# Patient Record
Sex: Female | Born: 1935
Health system: Southern US, Community
[De-identification: ages and names within clinical notes are randomized; demographics above are authoritative.]

## PROBLEM LIST (undated history)

## (undated) DIAGNOSIS — M797 Fibromyalgia: Secondary | ICD-10-CM

## (undated) DIAGNOSIS — K5790 Diverticulosis of intestine, part unspecified, without perforation or abscess without bleeding: Secondary | ICD-10-CM

## (undated) DIAGNOSIS — R202 Paresthesia of skin: Secondary | ICD-10-CM

## (undated) DIAGNOSIS — I10 Essential (primary) hypertension: Secondary | ICD-10-CM

## (undated) DIAGNOSIS — E785 Hyperlipidemia, unspecified: Secondary | ICD-10-CM

## (undated) DIAGNOSIS — M810 Age-related osteoporosis without current pathological fracture: Secondary | ICD-10-CM

## (undated) DIAGNOSIS — C649 Malignant neoplasm of unspecified kidney, except renal pelvis: Secondary | ICD-10-CM

## (undated) HISTORY — PX: TONSILLECTOMY: SUR1361

## (undated) HISTORY — DX: Malignant neoplasm of unspecified kidney, except renal pelvis: C64.9

## (undated) HISTORY — DX: Essential (primary) hypertension: I10

## (undated) HISTORY — DX: Age-related osteoporosis without current pathological fracture: M81.0

## (undated) HISTORY — DX: Diverticulosis of intestine, part unspecified, without perforation or abscess without bleeding: K57.90

## (undated) HISTORY — DX: Fibromyalgia: M79.7

## (undated) HISTORY — DX: Paresthesia of skin: R20.2

---

## 1998-02-28 ENCOUNTER — Other Ambulatory Visit: Admission: RE | Admit: 1998-02-28 | Discharge: 1998-02-28 | Payer: Self-pay | Admitting: Gynecology

## 1998-04-17 ENCOUNTER — Observation Stay (HOSPITAL_COMMUNITY): Admission: RE | Admit: 1998-04-17 | Discharge: 1998-04-18 | Payer: Self-pay | Admitting: *Deleted

## 1999-03-01 ENCOUNTER — Other Ambulatory Visit: Admission: RE | Admit: 1999-03-01 | Discharge: 1999-03-01 | Payer: Self-pay | Admitting: Gynecology

## 2000-04-16 ENCOUNTER — Other Ambulatory Visit: Admission: RE | Admit: 2000-04-16 | Discharge: 2000-04-16 | Payer: Self-pay | Admitting: Gynecology

## 2000-12-25 ENCOUNTER — Other Ambulatory Visit: Admission: RE | Admit: 2000-12-25 | Discharge: 2000-12-25 | Payer: Self-pay | Admitting: Gastroenterology

## 2001-05-06 ENCOUNTER — Other Ambulatory Visit: Admission: RE | Admit: 2001-05-06 | Discharge: 2001-05-06 | Payer: Self-pay | Admitting: Gynecology

## 2002-05-10 ENCOUNTER — Other Ambulatory Visit: Admission: RE | Admit: 2002-05-10 | Discharge: 2002-05-10 | Payer: Self-pay | Admitting: Gynecology

## 2002-11-17 ENCOUNTER — Ambulatory Visit (HOSPITAL_COMMUNITY): Admission: RE | Admit: 2002-11-17 | Discharge: 2002-11-17 | Payer: Self-pay | Admitting: Gastroenterology

## 2002-11-17 ENCOUNTER — Encounter (INDEPENDENT_AMBULATORY_CARE_PROVIDER_SITE_OTHER): Payer: Self-pay | Admitting: Specialist

## 2003-05-23 ENCOUNTER — Other Ambulatory Visit: Admission: RE | Admit: 2003-05-23 | Discharge: 2003-05-23 | Payer: Self-pay | Admitting: Gynecology

## 2005-03-14 ENCOUNTER — Encounter: Admission: RE | Admit: 2005-03-14 | Discharge: 2005-03-14 | Payer: Self-pay | Admitting: Internal Medicine

## 2005-10-18 ENCOUNTER — Ambulatory Visit (HOSPITAL_COMMUNITY): Admission: RE | Admit: 2005-10-18 | Discharge: 2005-10-18 | Payer: Self-pay | Admitting: Internal Medicine

## 2006-08-11 ENCOUNTER — Other Ambulatory Visit: Admission: RE | Admit: 2006-08-11 | Discharge: 2006-08-11 | Payer: Self-pay | Admitting: Gynecology

## 2008-08-08 ENCOUNTER — Encounter: Admission: RE | Admit: 2008-08-08 | Discharge: 2008-08-08 | Payer: Self-pay | Admitting: Internal Medicine

## 2008-08-22 ENCOUNTER — Encounter: Admission: RE | Admit: 2008-08-22 | Discharge: 2008-08-22 | Payer: Self-pay | Admitting: Internal Medicine

## 2008-09-02 DIAGNOSIS — C649 Malignant neoplasm of unspecified kidney, except renal pelvis: Secondary | ICD-10-CM

## 2008-09-02 HISTORY — DX: Malignant neoplasm of unspecified kidney, except renal pelvis: C64.9

## 2008-10-06 HISTORY — PX: KIDNEY SURGERY: SHX687

## 2011-01-18 NOTE — Op Note (Signed)
   NAMEDESSIREE, SZE [facility]                      ACCOUNT NO.:  [id number]   MEDICAL RECORD NO.:  [id number]                   PATIENT TYPE:  AMB   LOCATION:  ENDO                                 FACILITY:  [facility]   PHYSICIAN:  Bernette Redbird, M.D.                DATE OF BIRTH:  06-06-1927   DATE OF PROCEDURE:  11/17/2002  DATE OF DISCHARGE:                                 OPERATIVE REPORT   PROCEDURE:  Colonoscopy with polypectomy.   INDICATIONS FOR PROCEDURE:  This is a 77 year old female with a family  history of colon cancer in several aunts and a cousin all on the same side  of the family. Colonoscopy five years ago was unrevealing.   FINDINGS:  A 5 mm polyp removed from the transverse colon by snare  technique.   DESCRIPTION OF PROCEDURE:  The nature, purpose and risk of the procedure  were familiar to the patient from prior examination. She provided written  consent. Sedation was fentanyl 75 mcg and Versed 6 mg IV without arrhythmias  or desaturation.   The Olympus adult video colonoscope was advanced without too much  difficulty, taking out loops as I went, based on prior __________ exam to be  difficult with looping. We thus were able to reach the cecum as identified  by a typical cecal appearance without much difficulty and pullback was then  performed. The quality of the prep was excellent and it was felt that all  areas were well seen.   There was a 5 mm polyp snared in the proximal transverse colon but this was  otherwise a normal examination, without other polyps, cancer, colitis,  vascular malformations or diverticulosis. Retroflexion in the rectum showed  what appeared to be some internal hemorrhoids and reinspection of the  rectosigmoid was unremarkable. The patient tolerated the procedure well and  there were no apparent complications.   IMPRESSION:  Small colon polyp removed as described above.   PLAN:  Await pathology. Followup colonoscopy in three years if  adenomatous  in character.                                               Bernette Redbird, M.D.    RB/MEDQ  D:  11/17/2002  T:  11/17/2002  Job:  [id number]   cc:   Larina Earthly, M.D.  [address]  [place]  [place] [address]  Fax: [phone]

## 2011-10-21 ENCOUNTER — Other Ambulatory Visit: Payer: Self-pay | Admitting: Internal Medicine

## 2011-10-21 DIAGNOSIS — K7689 Other specified diseases of liver: Secondary | ICD-10-CM

## 2011-10-30 ENCOUNTER — Ambulatory Visit
Admission: RE | Admit: 2011-10-30 | Discharge: 2011-10-30 | Disposition: A | Discharge status: Home / Self Care | Payer: Medicare Other | Source: Ambulatory Visit | Attending: Internal Medicine | Admitting: Internal Medicine

## 2011-10-30 ENCOUNTER — Other Ambulatory Visit: Payer: Self-pay | Admitting: Internal Medicine

## 2011-10-30 DIAGNOSIS — K7689 Other specified diseases of liver: Secondary | ICD-10-CM

## 2012-05-03 HISTORY — PX: EYE SURGERY: SHX253

## 2012-11-09 ENCOUNTER — Emergency Department (HOSPITAL_COMMUNITY): Payer: Medicare HMO

## 2012-11-09 ENCOUNTER — Emergency Department (HOSPITAL_COMMUNITY)
Admission: EM | Admit: 2012-11-09 | Discharge: 2012-11-09 | Disposition: A | Discharge status: Home / Self Care | Payer: Medicare HMO | Attending: Emergency Medicine | Admitting: Emergency Medicine

## 2012-11-09 ENCOUNTER — Encounter (HOSPITAL_COMMUNITY): Payer: Self-pay | Admitting: Emergency Medicine

## 2012-11-09 ENCOUNTER — Other Ambulatory Visit: Payer: Self-pay | Admitting: Rheumatology

## 2012-11-09 DIAGNOSIS — Z79899 Other long term (current) drug therapy: Secondary | ICD-10-CM | POA: Insufficient documentation

## 2012-11-09 DIAGNOSIS — Y939 Activity, unspecified: Secondary | ICD-10-CM | POA: Insufficient documentation

## 2012-11-09 DIAGNOSIS — Y929 Unspecified place or not applicable: Secondary | ICD-10-CM | POA: Insufficient documentation

## 2012-11-09 DIAGNOSIS — S0990XA Unspecified injury of head, initial encounter: Secondary | ICD-10-CM | POA: Insufficient documentation

## 2012-11-09 DIAGNOSIS — W108XXA Fall (on) (from) other stairs and steps, initial encounter: Secondary | ICD-10-CM | POA: Insufficient documentation

## 2012-11-09 DIAGNOSIS — M8580 Other specified disorders of bone density and structure, unspecified site: Secondary | ICD-10-CM

## 2012-11-09 DIAGNOSIS — M25569 Pain in unspecified knee: Secondary | ICD-10-CM | POA: Insufficient documentation

## 2012-11-09 DIAGNOSIS — E785 Hyperlipidemia, unspecified: Secondary | ICD-10-CM | POA: Insufficient documentation

## 2012-11-09 HISTORY — DX: Hyperlipidemia, unspecified: E78.5

## 2012-11-09 MED ORDER — IBUPROFEN 800 MG PO TABS
800.0000 mg | ORAL_TABLET | Freq: Once | ORAL | Status: AC
  Administered 2012-11-09: 800 mg via ORAL
  Filled 2012-11-09: qty 1

## 2012-11-09 MED ORDER — TRAMADOL HCL 50 MG PO TABS: 50.0000 mg | ORAL_TABLET | Freq: Three times a day (TID) | ORAL | Status: DC | PRN

## 2012-11-09 NOTE — ED Notes (Addendum)
Pt presenting to ed with c/o falling while in [place] and hitting her head on the steps on the escalator. Pt denies Loc. Pt is alert and oriented at this time. Pt states there was a problem with the escalator in [place] the handrail was going opposite of the escalator. Pt states that's what caused  her to fall. Pt states that they had to turn the escalator off. Pt denies lightheadedness, dizziness, and chest pain at this time

## 2012-11-09 NOTE — ED Provider Notes (Signed)
History     CSN: [id number]  Arrival date & time 11/09/12  1550   First MD Initiated Contact with Patient 11/09/12 1652      Chief Complaint  Patient presents with  . Fall  . Head Injury    (Consider location/radiation/quality/duration/timing/severity/associated sxs/prior treatment) HPI  The patient presents after falling while on an escalator.  She states that she fell backwards, striking her head and upper shoulders.  No loss of consciousness, no subsequent confusion, disorientation, vomiting, diarrhea, incontinence.  The patient has not been able to recent event.  She also complains of left knee pain.  No attempts at relief thus far. The patient states that she was in her usual state of health prior to the fall.   Past Medical History  Diagnosis Date  . Hyperlipidemia     Past Surgical History  Procedure Laterality Date  . Kidney surgery    . Tonsillectomy      No family history on file.  History  Substance Use Topics  . Smoking status: Never Smoker   . Smokeless tobacco: Not on file  . Alcohol Use: No    OB History   Grav Para Term Preterm Abortions TAB SAB Ect Mult Living                  Review of Systems  All other systems reviewed and are negative.    Allergies  Latex  Home Medications   Current Outpatient Rx  Name  Route  Sig  Dispense  Refill  . calcium carbonate (OS-CAL) 600 MG TABS   Oral   Take 600 mg by mouth 2 (two) times daily with a meal.         . cholecalciferol (VITAMIN D) 1000 UNITS tablet   Oral   Take 1,000 Units by mouth daily.         Marland Kitchen Fexofenadine HCl (ALLEGRA PO)   Oral   Take 1 tablet by mouth daily.         . fish oil-omega-3 fatty acids 1000 MG capsule   Oral   Take 1 g by mouth daily.         . Multiple Vitamin (MULTIVITAMIN WITH MINERALS) TABS   Oral   Take 1 tablet by mouth daily.         . Pitavastatin Calcium (LIVALO) 2 MG TABS   Oral   Take 1 tablet by mouth daily.           BP  181/67  Pulse 71  Temp(Src) 98.5 F (36.9 C) (Oral)  Resp 20  SpO2 100%  Physical Exam  Nursing note and vitals reviewed. Constitutional: She is oriented to person, place, and time. She appears well-developed and well-nourished. No distress.  HENT:  Head: Normocephalic and atraumatic.  No discernible abnormalities, no bony offset, no active bleeding  Eyes: Conjunctivae and EOM are normal.  Neck: Normal range of motion. No spinous process tenderness and no muscular tenderness present. No tracheal deviation present.  Cardiovascular: Normal rate and regular rhythm.   Pulmonary/Chest: Effort normal and breath sounds normal. No stridor. No respiratory distress.  Abdominal: She exhibits no distension.  Musculoskeletal: She exhibits no edema.  Mild tenderness to palpation across the tops of both shoulders, with no restricted range of motion, no loss of strength.  No appreciable deformities.  The left knee has appropriate range of motion and strength throughout.  There is no appreciable deformity, no ecchymosis, there is mild tenderness to palpation diffusely.  Neurological:  She is alert and oriented to person, place, and time. No cranial nerve deficit.  Skin: Skin is warm and dry.  Psychiatric: She has a normal mood and affect.    ED Course  Procedures (including critical care time)  Labs Reviewed - No data to display Dg Chest 2 View  11/09/2012  *RADIOLOGY REPORT*  Clinical Data: Larey Seat.  Chest pain.  CHEST - 2 VIEW  Comparison: None.  Findings: Normal sized heart.  Clear lungs.  The lungs are mildly hyperexpanded.  No fracture pneumothorax seen.  Thoracic spine degenerative changes.  IMPRESSION: No acute abnormality.  Mild changes of COPD.   Original Report Authenticated By: Beckie Salts, M.D.    Ct Head Wo Contrast  11/09/2012  *RADIOLOGY REPORT*  Clinical Data: Altered mental status.  Status post fall striking back of head.  CT HEAD WITHOUT CONTRAST  Technique:  Contiguous axial images  were obtained from the base of the skull through the vertex without contrast.  Comparison: None.  Findings: Negative for intracranial hemorrhage.  The scalp soft tissues appear symmetric.  No scalp hematoma is identified.  The ventricles appear upper normal to slightly prominent in size relative to the degree of mild cerebral volume loss.  There are no prior head CTs for comparison.  Negative for midline shift, mass effect, or evidence of acute infarction.  The skull is intact.  The visualized paranasal sinuses and mastoid air cells are clear.  IMPRESSION: 1.  The ventricles appear upper limits of normal versus slightly prominent in size for the degree of cerebral atrophy.  The possibility of developing normal pressure hydrocephalus cannot be completely excluded. There are no prior head CTs for comparison. 2.  Negative for hemorrhage or evidence of acute infarction.   Original Report Authenticated By: Britta Mccreedy, M.D.    Dg Knee Ap/lat W/sunrise Left  11/09/2012  *RADIOLOGY REPORT*  Clinical Data: Left knee pain following a fall.  DG KNEE - 3 VIEWS  Comparison: None.  Findings: AP, lateral and sunrise patellar views were requested and performed.  There is marked lateral patellofemoral joint space narrowing with associated spur formation.  There is also mild lateral and minimal medial spur formation.  No fracture, dislocation or effusion is seen on these views.  IMPRESSION:  1.  Limited examination due to the lack of oblique views. 2.  No fracture or dislocation seen. 3.  Tricompartmental degenerative changes, most pronounced involving the lateral patellofemoral joint.   Original Report Authenticated By: Beckie Salts, M.D.      No diagnosis found.    MDM  This pleasant elderly female presents after a mechanical fall.  On exam she is awake, alert, oriented, with no remarkably abnormal vital signs.  Given the patient's ongoing head pain, the head trauma, she had radiographic studies.  These are largely  reassuring, and absent any new distress, she is appropriate for discharge to    Gerhard Munch, MD 11/09/12 1955

## 2012-11-17 ENCOUNTER — Ambulatory Visit
Admission: RE | Admit: 2012-11-17 | Discharge: 2012-11-17 | Disposition: A | Discharge status: Home / Self Care | Payer: Medicare HMO | Source: Ambulatory Visit | Attending: Rheumatology | Admitting: Rheumatology

## 2012-11-17 DIAGNOSIS — M8580 Other specified disorders of bone density and structure, unspecified site: Secondary | ICD-10-CM

## 2014-10-07 DIAGNOSIS — M1711 Unilateral primary osteoarthritis, right knee: Secondary | ICD-10-CM | POA: Diagnosis not present

## 2014-10-07 DIAGNOSIS — G5702 Lesion of sciatic nerve, left lower limb: Secondary | ICD-10-CM | POA: Diagnosis not present

## 2014-10-07 DIAGNOSIS — M1712 Unilateral primary osteoarthritis, left knee: Secondary | ICD-10-CM | POA: Diagnosis not present

## 2014-10-21 DIAGNOSIS — M1711 Unilateral primary osteoarthritis, right knee: Secondary | ICD-10-CM | POA: Diagnosis not present

## 2014-10-21 DIAGNOSIS — M1712 Unilateral primary osteoarthritis, left knee: Secondary | ICD-10-CM | POA: Diagnosis not present

## 2014-10-21 DIAGNOSIS — M25512 Pain in left shoulder: Secondary | ICD-10-CM | POA: Diagnosis not present

## 2014-10-21 DIAGNOSIS — M542 Cervicalgia: Secondary | ICD-10-CM | POA: Diagnosis not present

## 2015-04-07 DIAGNOSIS — E785 Hyperlipidemia, unspecified: Secondary | ICD-10-CM | POA: Diagnosis not present

## 2015-04-07 DIAGNOSIS — M810 Age-related osteoporosis without current pathological fracture: Secondary | ICD-10-CM | POA: Diagnosis not present

## 2015-04-07 DIAGNOSIS — I10 Essential (primary) hypertension: Secondary | ICD-10-CM | POA: Diagnosis not present

## 2015-04-14 DIAGNOSIS — R209 Unspecified disturbances of skin sensation: Secondary | ICD-10-CM | POA: Diagnosis not present

## 2015-04-14 DIAGNOSIS — Z23 Encounter for immunization: Secondary | ICD-10-CM | POA: Diagnosis not present

## 2015-04-14 DIAGNOSIS — E785 Hyperlipidemia, unspecified: Secondary | ICD-10-CM | POA: Diagnosis not present

## 2015-04-14 DIAGNOSIS — M810 Age-related osteoporosis without current pathological fracture: Secondary | ICD-10-CM | POA: Diagnosis not present

## 2015-04-14 DIAGNOSIS — I10 Essential (primary) hypertension: Secondary | ICD-10-CM | POA: Diagnosis not present

## 2015-04-14 DIAGNOSIS — K5730 Diverticulosis of large intestine without perforation or abscess without bleeding: Secondary | ICD-10-CM | POA: Diagnosis not present

## 2015-04-14 DIAGNOSIS — E559 Vitamin D deficiency, unspecified: Secondary | ICD-10-CM | POA: Diagnosis not present

## 2015-04-14 DIAGNOSIS — M797 Fibromyalgia: Secondary | ICD-10-CM | POA: Diagnosis not present

## 2015-04-14 DIAGNOSIS — C649 Malignant neoplasm of unspecified kidney, except renal pelvis: Secondary | ICD-10-CM | POA: Diagnosis not present

## 2015-04-14 DIAGNOSIS — Z0000 Encounter for general adult medical examination without abnormal findings: Secondary | ICD-10-CM | POA: Diagnosis not present

## 2015-04-18 DIAGNOSIS — Z1212 Encounter for screening for malignant neoplasm of rectum: Secondary | ICD-10-CM | POA: Diagnosis not present

## 2015-04-19 DIAGNOSIS — H04123 Dry eye syndrome of bilateral lacrimal glands: Secondary | ICD-10-CM | POA: Diagnosis not present

## 2015-04-19 DIAGNOSIS — H35033 Hypertensive retinopathy, bilateral: Secondary | ICD-10-CM | POA: Diagnosis not present

## 2015-04-19 DIAGNOSIS — H3531 Nonexudative age-related macular degeneration: Secondary | ICD-10-CM | POA: Diagnosis not present

## 2015-04-19 DIAGNOSIS — H04203 Unspecified epiphora, bilateral lacrimal glands: Secondary | ICD-10-CM | POA: Diagnosis not present

## 2015-04-21 ENCOUNTER — Encounter: Payer: Self-pay | Admitting: *Deleted

## 2015-04-24 ENCOUNTER — Encounter: Payer: Self-pay | Admitting: Diagnostic Neuroimaging

## 2015-04-24 ENCOUNTER — Ambulatory Visit (INDEPENDENT_AMBULATORY_CARE_PROVIDER_SITE_OTHER): Payer: Medicare PPO | Admitting: Diagnostic Neuroimaging

## 2015-04-24 VITALS — BP 146/77 | HR 85 | Ht 66.75 in | Wt 177.8 lb

## 2015-04-24 DIAGNOSIS — E559 Vitamin D deficiency, unspecified: Secondary | ICD-10-CM | POA: Diagnosis not present

## 2015-04-24 DIAGNOSIS — R269 Unspecified abnormalities of gait and mobility: Secondary | ICD-10-CM

## 2015-04-24 DIAGNOSIS — R202 Paresthesia of skin: Secondary | ICD-10-CM | POA: Diagnosis not present

## 2015-04-24 DIAGNOSIS — R200 Anesthesia of skin: Secondary | ICD-10-CM

## 2015-04-24 DIAGNOSIS — M6289 Other specified disorders of muscle: Secondary | ICD-10-CM | POA: Diagnosis not present

## 2015-04-24 DIAGNOSIS — R531 Weakness: Secondary | ICD-10-CM

## 2015-04-24 NOTE — Progress Notes (Signed)
[facility]  PATIENT: Jennifer Hall DOB: Mar 09, 1927  REFERRING CLINICIAN: Avva HISTORY FROM: patient  REASON FOR VISIT: new consult    HISTORICAL  CHIEF COMPLAINT:  Chief Complaint  Patient presents with  . Numbness    Pt c/o numbness, tingling in bilateral upper and lower extremities and burning sensation    HISTORY OF PRESENT ILLNESS:   81 year old right-handed female here for evaluation of numbness and tingling. History of hypercholesterolemia and fibromyalgia. History of right renal cell carcinoma status post resection 2010.  For past 3-4 years patient has had onset of burning and tingling sensation in her legs. Started between her ankles and knees. Eventually her toes, feet were also involved. Over the past 2 months her hands and fingers have also been normal.  Patient also having joint pain in her bilateral knees, right hip, left shoulder. She also has fibromyalgia symptomatology including multiple tender spots throughout her body. She is seeing rheumatology Dr. Estanislado Pandy.   Patient lives alone and otherwise is quite independent.   REVIEW OF SYSTEMS: Full 14 system review of systems performed and notable only for numbness weakness restless legs decreased energy allergies joint pain joint swelling aching muscles easy bruising component stool hearing loss ringing in ears fatigue.  ALLERGIES: Allergies  Allergen Reactions  . Latex Rash    HOME MEDICATIONS: Outpatient Prescriptions Prior to Visit  Medication Sig Dispense Refill  . calcium carbonate (OS-CAL) 600 MG TABS Take 600 mg by mouth 2 (two) times daily with a meal.    . Fexofenadine HCl (ALLEGRA PO) Take 1 tablet by mouth daily.    . fish oil-omega-3 fatty acids 1000 MG capsule Take 1 g by mouth daily.    . Multiple Vitamin (MULTIVITAMIN WITH MINERALS) TABS Take 1 tablet by mouth daily.    . Pitavastatin Calcium (LIVALO) 2 MG TABS Take 1 tablet by mouth daily.    . cholecalciferol  (VITAMIN D) 1000 UNITS tablet Take 1,000 Units by mouth daily.    . traMADol (ULTRAM) 50 MG tablet Take 1 tablet (50 mg total) by mouth every 8 (eight) hours as needed for pain. 15 tablet 0   No facility-administered medications prior to visit.    PAST MEDICAL HISTORY: Past Medical History  Diagnosis Date  . Hyperlipidemia   . Paresthesias   . Renal cell cancer 2010  . HTN (hypertension)   . Fibromyalgia   . Osteoporosis   . Diverticulosis     PAST SURGICAL HISTORY: Past Surgical History  Procedure Laterality Date  . Kidney surgery  10/06/08    biopsy, resection  . Tonsillectomy    . Eye surgery Right 9/13    cataract    FAMILY HISTORY: Family History  Problem Relation Age of Onset  . Hypertension Father   . Lymphoma Mother     SOCIAL HISTORY:  Social History   Social History  . Marital Status: Widowed    Spouse Name: N/A  . Number of Children: 2  . Years of Education: MS   Occupational History  . retired     Animal nutritionist, [employer]   Social History Main Topics  . Smoking status: Never Smoker   . Smokeless tobacco: Never Used  . Alcohol Use: No  . Drug Use: No  . Sexual Activity: Not on file   Other Topics Concern  . Not on file   Social History Narrative   Patient lives at home alone.   Caffeine Use: rarely     PHYSICAL EXAM  GENERAL EXAM/CONSTITUTIONAL: Vitals:  Filed Vitals:   04/24/15 1026  BP: 146/77  Pulse: 85  Height: 5' 6.75" (1.695 m)  Weight: 177 lb 12.8 oz (80.65 kg)     Body mass index is 28.07 kg/(m^2).  Visual Acuity Screening   Right eye Left eye Both eyes  Without correction:     With correction: 20/30 20/30      Patient is in no distress; well developed, nourished and groomed; neck is supple  CARDIOVASCULAR:  Examination of carotid arteries is normal; no carotid bruits  Regular rate and rhythm, no murmurs  Examination of peripheral vascular system by observation and palpation is  normal  EYES:  Ophthalmoscopic exam of optic discs and posterior segments is normal; no papilledema or hemorrhages  MUSCULOSKELETAL:  Gait, strength, tone, movements noted in Neurologic exam below  NEUROLOGIC: MENTAL STATUS:  No flowsheet data found.  awake, alert, oriented to person, place and time  recent and remote memory intact  normal attention and concentration  language fluent, comprehension intact, naming intact,   fund of knowledge appropriate  MILD APRAXIA VS HEARING LOSS RELATED COMPREHENSION DIFF  CRANIAL NERVE:   2nd - no papilledema on fundoscopic exam  2nd, 3rd, 4th, 6th - pupils equal and reactive to light, visual fields full to confrontation, extraocular muscles intact, no nystagmus  5th - facial sensation symmetric  7th - facial strength symmetric  8th - DECR HEARING (HAS HEARING AIDS)  9th - palate elevates symmetrically, uvula midline  11th - shoulder shrug symmetric  12th - tongue protrusion midline  MOTOR:   normal bulk and tone, full strength in the BUE, BLE; EXCEPT LUE FINGER ABDUCTION 3/5, DIGITS 4TH AND 5TH FLEXION 4/5; LEFT HIP FLEXION 3/5  SENSORY:   RUE: DECR PP IN INDEX FINGER  LUE: DECR PP IN INDEX FINGER  BLE DECR PP, TEMP, VIBRATION (4SEC AT TOES); LEFT FOOT MORE DECR THAN RIGHT FOOT;   COORDINATION:   finger-nose-finger, fine finger movements --> RUE NORMAL; LUE DYSMETRIA IN FTN  REFLEXES:   deep tendon reflexes: BUE 1, BLE TRACE  GAIT/STATION:   narrow based gait; UNSTEADY; CANNOT TANDEM.    DIAGNOSTIC DATA (LABS, IMAGING, TESTING) - I reviewed patient records, labs, notes, testing and imaging myself where available.  No results found for: WBC, HGB, HCT, MCV, PLT No results found for: NA, K, CL, CO2, GLUCOSE, BUN, CREATININE, CALCIUM, PROT, ALBUMIN, AST, ALT, ALKPHOS, BILITOT, GFRNONAA, GFRAA No results found for: CHOL, HDL, LDLCALC, LDLDIRECT, TRIG, CHOLHDL No results found for: HGBA1C No results found  for: VITAMINB12 No results found for: TSH   11/09/12 CT head [I reviewed images myself and agree with interpretation. -VRP]  1. The ventricles appear upper limits of normal versus slightly prominent in size for the degree of cerebral atrophy. The possibility of developing normal pressure hydrocephalus cannot be completely excluded. There are no prior head CTs for comparison. 2. Negative for hemorrhage or evidence of acute infarction.    ASSESSMENT AND PLAN  81 y.o. year old female here with progressive numbness, burning pain, weakness in upper and lower extremities over past 3-4 years. Exam notable for decreased sensation in bilateral hands and legs (left worse than right; legs worse then hands); decr strength in left hand and left leg; balance and gait difficulty; mild comprehension difficulty.  Ddx: multi-focal neuropathy, polyradiculopathy, cervical spine lesion, brain lesion   PLAN: - neuropathy panel labs - EMG/NCS - then may consider MRI brain and cervical spine if necessary  Orders Placed This  Encounter  Procedures  . Neuropathy Panel  . NCV with EMG(electromyography)   Return for for NCV/EMG.    Penni Bombard, MD 1/62/4469, 50:72 AM Certified in Neurology, Neurophysiology and Neuroimaging  [facility] [address], [place], [place] [address] [phone]

## 2015-04-24 NOTE — Patient Instructions (Signed)
I will check additional testing. 

## 2015-04-26 LAB — NEUROPATHY PANEL
A/G Ratio: 1.2 (ref 0.7–1.7)
ALBUMIN ELP: 3.7 g/dL (ref 2.9–4.4)
ANA: NEGATIVE
Alpha 1: 0.3 g/dL (ref 0.0–0.4)
Alpha 2: 0.7 g/dL (ref 0.4–1.0)
Angio Convert Enzyme: 48 U/L (ref 14–82)
BETA: 1.2 g/dL (ref 0.7–1.3)
GAMMA GLOBULIN: 1 g/dL (ref 0.4–1.8)
GLOBULIN, TOTAL: 3.2 g/dL (ref 2.2–3.9)
RHEUMATOID FACTOR: 10.3 [IU]/mL (ref 0.0–13.9)
Sed Rate: 6 mm/hr (ref 0–40)
TSH: 1.45 u[IU]/mL (ref 0.450–4.500)
Total Protein: 6.9 g/dL (ref 6.0–8.5)
Vit D, 25-Hydroxy: 52.2 ng/mL (ref 30.0–100.0)
Vitamin B-12: 625 pg/mL (ref 211–946)

## 2015-04-27 ENCOUNTER — Telehealth: Payer: Self-pay | Admitting: *Deleted

## 2015-04-27 ENCOUNTER — Telehealth: Payer: Self-pay

## 2015-04-27 NOTE — Telephone Encounter (Signed)
-----   Message from Penni Bombard, MD sent at 04/27/2015  8:17 AM EDT ----- pls call patient with normal labs. -VRP

## 2015-04-27 NOTE — Telephone Encounter (Signed)
I LMVM for pt that lab work done in office is normal.   She is to call back if questions.

## 2015-04-27 NOTE — Telephone Encounter (Signed)
Spoke to patient. Gave lab results. Patient verbalized understanding.  

## 2015-04-28 DIAGNOSIS — M545 Low back pain: Secondary | ICD-10-CM | POA: Diagnosis not present

## 2015-04-28 DIAGNOSIS — M7071 Other bursitis of hip, right hip: Secondary | ICD-10-CM | POA: Diagnosis not present

## 2015-04-28 DIAGNOSIS — M25562 Pain in left knee: Secondary | ICD-10-CM | POA: Diagnosis not present

## 2015-04-28 DIAGNOSIS — M797 Fibromyalgia: Secondary | ICD-10-CM | POA: Diagnosis not present

## 2015-04-28 DIAGNOSIS — M25561 Pain in right knee: Secondary | ICD-10-CM | POA: Diagnosis not present

## 2015-05-03 ENCOUNTER — Other Ambulatory Visit (HOSPITAL_COMMUNITY): Payer: Self-pay | Admitting: Rheumatology

## 2015-05-03 DIAGNOSIS — M545 Low back pain: Secondary | ICD-10-CM

## 2015-05-12 DIAGNOSIS — R202 Paresthesia of skin: Secondary | ICD-10-CM | POA: Diagnosis not present

## 2015-05-17 ENCOUNTER — Ambulatory Visit (HOSPITAL_COMMUNITY)
Admission: RE | Admit: 2015-05-17 | Discharge: 2015-05-17 | Disposition: A | Discharge status: Home / Self Care | Payer: Medicare PPO | Source: Ambulatory Visit | Attending: Rheumatology | Admitting: Rheumatology

## 2015-05-17 DIAGNOSIS — M79604 Pain in right leg: Secondary | ICD-10-CM | POA: Diagnosis not present

## 2015-05-17 DIAGNOSIS — M47896 Other spondylosis, lumbar region: Secondary | ICD-10-CM | POA: Insufficient documentation

## 2015-05-17 DIAGNOSIS — M5126 Other intervertebral disc displacement, lumbar region: Secondary | ICD-10-CM | POA: Diagnosis not present

## 2015-05-17 DIAGNOSIS — M545 Low back pain: Secondary | ICD-10-CM | POA: Diagnosis not present

## 2015-05-17 DIAGNOSIS — M79605 Pain in left leg: Secondary | ICD-10-CM | POA: Diagnosis not present

## 2015-05-17 DIAGNOSIS — M4806 Spinal stenosis, lumbar region: Secondary | ICD-10-CM | POA: Insufficient documentation

## 2015-05-18 ENCOUNTER — Ambulatory Visit: Payer: Medicare PPO | Admitting: Diagnostic Neuroimaging

## 2015-05-24 DIAGNOSIS — M5416 Radiculopathy, lumbar region: Secondary | ICD-10-CM | POA: Diagnosis not present

## 2015-05-26 DIAGNOSIS — M1711 Unilateral primary osteoarthritis, right knee: Secondary | ICD-10-CM | POA: Diagnosis not present

## 2015-05-26 DIAGNOSIS — M1712 Unilateral primary osteoarthritis, left knee: Secondary | ICD-10-CM | POA: Diagnosis not present

## 2015-06-02 DIAGNOSIS — M1711 Unilateral primary osteoarthritis, right knee: Secondary | ICD-10-CM | POA: Diagnosis not present

## 2015-06-02 DIAGNOSIS — M1712 Unilateral primary osteoarthritis, left knee: Secondary | ICD-10-CM | POA: Diagnosis not present

## 2015-06-05 DIAGNOSIS — M5416 Radiculopathy, lumbar region: Secondary | ICD-10-CM | POA: Diagnosis not present

## 2015-06-09 DIAGNOSIS — M1711 Unilateral primary osteoarthritis, right knee: Secondary | ICD-10-CM | POA: Diagnosis not present

## 2015-06-09 DIAGNOSIS — M1712 Unilateral primary osteoarthritis, left knee: Secondary | ICD-10-CM | POA: Diagnosis not present

## 2015-06-15 DIAGNOSIS — Z1231 Encounter for screening mammogram for malignant neoplasm of breast: Secondary | ICD-10-CM | POA: Diagnosis not present

## 2015-06-15 DIAGNOSIS — Z803 Family history of malignant neoplasm of breast: Secondary | ICD-10-CM | POA: Diagnosis not present

## 2015-07-12 DIAGNOSIS — H04203 Unspecified epiphora, bilateral lacrimal glands: Secondary | ICD-10-CM | POA: Diagnosis not present

## 2015-07-12 DIAGNOSIS — H01003 Unspecified blepharitis right eye, unspecified eyelid: Secondary | ICD-10-CM | POA: Diagnosis not present

## 2015-07-12 DIAGNOSIS — H40023 Open angle with borderline findings, high risk, bilateral: Secondary | ICD-10-CM | POA: Diagnosis not present

## 2015-07-12 DIAGNOSIS — H04123 Dry eye syndrome of bilateral lacrimal glands: Secondary | ICD-10-CM | POA: Diagnosis not present

## 2015-07-19 DIAGNOSIS — M4806 Spinal stenosis, lumbar region: Secondary | ICD-10-CM | POA: Diagnosis not present

## 2015-07-31 DIAGNOSIS — M4806 Spinal stenosis, lumbar region: Secondary | ICD-10-CM | POA: Diagnosis not present

## 2015-07-31 DIAGNOSIS — M5416 Radiculopathy, lumbar region: Secondary | ICD-10-CM | POA: Diagnosis not present

## 2015-10-02 ENCOUNTER — Ambulatory Visit: Payer: Medicare Other | Attending: Orthopaedic Surgery

## 2015-10-02 DIAGNOSIS — M24569 Contracture, unspecified knee: Secondary | ICD-10-CM | POA: Diagnosis present

## 2015-10-02 DIAGNOSIS — M5441 Lumbago with sciatica, right side: Secondary | ICD-10-CM

## 2015-10-02 DIAGNOSIS — R262 Difficulty in walking, not elsewhere classified: Secondary | ICD-10-CM | POA: Diagnosis present

## 2015-10-02 DIAGNOSIS — R531 Weakness: Secondary | ICD-10-CM | POA: Insufficient documentation

## 2015-10-02 NOTE — Patient Instructions (Signed)
Double Knee to Chest (Flexion)   Gently pull both knees toward chest. Feel stretch in lower back or buttock area. Breathing deeply, Hold _30___ seconds. Repeat __3_ times. Do __3_ sessions per day.  Knee to Chest (Flexion)   Pull knee toward chest. Feel stretch in lower back or buttock area. Breathing deeply, Hold _30___ seconds. Repeat with other knee. Repeat __3__ times. Do __3__ sessions per day.

## 2015-10-02 NOTE — Therapy (Signed)
[place], [place], [address] Phone: [phone]   Fax:  [phone]  Physical Therapy Evaluation  Patient Details  Name: Jennifer Hall MRN: [id number] Date of Birth: December 28, 1926 Referring Provider: Durward Fortes  Encounter Date: 10/02/2015      PT End of Session - 10/02/15 1029    Visit Number 1   Number of Visits 16   Date for PT Re-Evaluation 11/24/15   PT Start Time 1020   PT Stop Time 1100   PT Time Calculation (min) 40 min   Activity Tolerance Patient tolerated treatment well   Behavior During Therapy [facility] for tasks assessed/performed      Past Medical History  Diagnosis Date  . Hyperlipidemia   . Paresthesias   . Renal cell cancer 2010  . HTN (hypertension)   . Fibromyalgia   . Osteoporosis   . Diverticulosis     Past Surgical History  Procedure Laterality Date  . Kidney surgery  10/06/08    biopsy, resection  . Tonsillectomy    . Eye surgery Right 9/13    cataract    There were no vitals filed for this visit.  Visit Diagnosis:  Midline low back pain with right-sided sciatica  Weakness  Flexion contracture of knee, unspecified laterality  Difficulty in walking      Subjective Assessment - 10/02/15 1019    Subjective Pt reports of LBP that radiates into R LE ( lateral leg, R>L). Pain with "strenous" activities like standing and walking. Pt had injections into Hip and Back with no relief noted. Pt also c/o of burning sensation into bilat Lower legs R>L. Pt also notes "problems with knees", knee pain occasionally.    Pertinent History Kidney CA 2010, HTN, hyperlipidemia, OA    Limitations Standing;Walking   How long can you sit comfortably? NA  Pain with sit to stand    How long can you stand comfortably? 15 mins    How long can you walk comfortably? 5 mins    Diagnostic tests MRI shows DDD, spondylosis, and disc extrusion L3-4   Currently in Pain? Yes   Pain Score 8    Worst 8, now walking 6, sitting at rest 0/10    Pain Location Back  Back into hip pain    Pain Orientation Left   Pain Descriptors / Indicators Burning;Aching   Pain Onset More than a month ago   Pain Frequency Intermittent   Aggravating Factors  stadning and walking, sit to stand movement    Pain Relieving Factors sitting, lay down, medication, heat, topical lotion    Effect of Pain on Daily Activities difficulty wit hprolonged walking.             [facility] PT Assessment - 10/02/15 0001    Assessment   Medical Diagnosis LBP, stenosis   Referring Provider [place]   Onset Date/Surgical Date 06/02/15   Hand Dominance Right   Next MD Visit 10/27/15   Prior Therapy injections    Precautions   Precautions None   Restrictions   Weight Bearing Restrictions No   Balance Screen   Has the patient fallen in the past 6 months No   [place] residence   Prior Function   Level of Independence Independent   Cognition   Overall Cognitive Status Within Functional Limits for tasks assessed   Observation/Other Assessments   Focus on Therapeutic Outcomes (FOTO)  Intake 44% limited, Predicted 41% limited   ROM /  Strength   AROM / PROM / Strength AROM;PROM;Strength   AROM   Overall AROM Comments Bilat knee extension lacking 22 degrees from neutral    AROM Assessment Site Lumbar   Lumbar Flexion 70   Lumbar Extension 15  ERP   Lumbar - Right Side Bend 5   Lumbar - Left Side Bend 10   PROM   Overall PROM Comments HS length WNL bilat.    PROM Assessment Site Hip;Knee   Right/Left Hip Right;Left   Left Hip Extension 0  Right hip extension 0 degrees   Right/Left Knee Right;Left   Right Knee Extension -22  flexion contractor bilaterally   Left Knee Extension -22   Strength   Strength Assessment Site Hip;Knee;Ankle   Right/Left Hip Right;Left   Right Hip Flexion 3+/5   Right Hip ABduction 2+/5   Left Hip Flexion 3/5   Left Hip ABduction 2+/5    Right/Left Knee Right;Left   Right Knee Flexion 4-/5   Right Knee Extension 4/5   Left Knee Flexion 3+/5   Left Knee Extension 4/5   Right/Left Ankle Right;Left   Special Tests    Special Tests Lumbar   Lumbar Tests other2   other   Findings Positive   Side Right   Comment LUMBAR FORAMINAL CLOSURE                   [facility] Adult PT Treatment/Exercise - 10/02/15 0001    Exercises   Exercises Lumbar   Lumbar Exercises: Stretches   Single Knee to Chest Stretch 3 reps;30 seconds  HEP   Double Knee to Chest Stretch 3 reps;30 seconds  HEP   Modalities   Modalities Moist Heat   Moist Heat Therapy   Number Minutes Moist Heat 10 Minutes  DURING ther ex supine on mat   Moist Heat Location Lumbar Spine                PT Education - 10/02/15 1837    Education provided Yes   Education Details PT POC, Est HEP of SKTC and DKTC   Person(s) Educated Patient   Methods Explanation;Demonstration;Verbal cues   Comprehension Verbalized understanding;Need further instruction          PT Short Term Goals - 10/02/15 1841    PT SHORT TERM GOAL #1   Title Pt will be indep with HEP by 10/31/15   Time 4   Period Weeks   Status New   PT SHORT TERM GOAL #2   Title R SB will improve to 10 decrease in oder to transfer sit to stand pain- free by 10/23/15.    Time 4   Period Weeks   Status New           PT Long Term Goals - 10/02/15 1843    PT LONG TERM GOAL #1   Title FOTO will improve from 44% limited to 41% limited by 11/24/15.    Time 8   Period Weeks   Status New   PT LONG TERM GOAL #2   Title Bilat hip strength will improve to 3/5 in order for pt to tolerate standing  >30 mins by 11/24/15.    Time 8   Period Weeks   Status New   PT LONG TERM GOAL #3   Title Bilat hip extension PROM will improve to 5 degrees in order for pt to tolerate standing and walking for >30 mins by 11/24/15.    Time 8   Period Weeks  Status New               Plan - 2015/10/09  1837    Clinical Impression Statement Pt presents for low complexity evaluation for lumbar spine and R hip pain. Signs and symptoms are compatible with lumbar stenosis with R LE radiculopathy. Pt presents with impairments including pain, impaired mobility/ROM, and impaired strength, which limit functional abilities with gait, standing, sit to stand transitions, bending, lifting.  Pt will benefit from oupt PT for 2 times a week for 8 weeks for core strengthening, stretching, education on positioning and body mechanics in order to address these impairments and functional limitations and return pt to pain-free PLOF.   Pt will benefit from skilled therapeutic intervention in order to improve on the following deficits Abnormal gait;Decreased activity tolerance;Decreased mobility;Decreased range of motion;Decreased strength;Increased muscle spasms;Difficulty walking;Pain   Rehab Potential Good   PT Frequency 2x / week   PT Duration 8 weeks   PT Treatment/Interventions ADLs/Self Care Home Management;Ultrasound;Cryotherapy;Electrical Stimulation;Moist Heat;Therapeutic exercise;Therapeutic activities;Functional mobility training;Stair training;Gait training;Neuromuscular re-education;Patient/family education;Manual techniques;Dry needling   PT Next Visit Plan Review/ Progress HEP. Core strengthening. Hip Flexor/ quad stretching.    PT Home Exercise Plan SKTC and DKTC   Consulted and Agree with Plan of Care Patient          G-Codes - 09-Oct-2015 1853    Functional Assessment Tool Used FOTO   Functional Limitation Mobility: Walking and moving around   Mobility: Walking and Moving Around Current Status [phone]) At least 40 percent but less than 60 percent impaired, limited or restricted   Mobility: Walking and Moving Around Goal Status [id number]) At least 20 percent but less than 40 percent impaired, limited or restricted       Problem List There are no active problems to display for this patient.   Dollene Cleveland, PT 10/09/15, 6:56 PM  [facility] [address] [place], [place], [address] Phone: [phone]   Fax:  [phone]  Name: Jennifer Hall MRN: [id number] Date of Birth: [birth year]

## 2015-10-05 ENCOUNTER — Ambulatory Visit: Payer: Medicare Other | Attending: Orthopaedic Surgery

## 2015-10-05 DIAGNOSIS — R262 Difficulty in walking, not elsewhere classified: Secondary | ICD-10-CM

## 2015-10-05 DIAGNOSIS — R531 Weakness: Secondary | ICD-10-CM | POA: Insufficient documentation

## 2015-10-05 DIAGNOSIS — M5441 Lumbago with sciatica, right side: Secondary | ICD-10-CM | POA: Insufficient documentation

## 2015-10-05 DIAGNOSIS — M24569 Contracture, unspecified knee: Secondary | ICD-10-CM | POA: Diagnosis present

## 2015-10-05 NOTE — Therapy (Signed)
[place], [place], [address] Phone: [phone]   Fax:  [phone]  Physical Therapy Treatment  Patient Details  Name: Jennifer Hall MRN: [id number] Date of Birth: 1926-12-13 Referring Provider: Durward Fortes  Encounter Date: 10/05/2015      PT End of Session - 10/05/15 1205    Visit Number 2   Number of Visits 16   Date for PT Re-Evaluation 11/24/15   PT Start Time 1020   PT Stop Time 1100   PT Time Calculation (min) 40 min   Activity Tolerance Patient tolerated treatment well   Behavior During Therapy [facility] for tasks assessed/performed      Past Medical History  Diagnosis Date  . Hyperlipidemia   . Paresthesias   . Renal cell cancer 2010  . HTN (hypertension)   . Fibromyalgia   . Osteoporosis   . Diverticulosis     Past Surgical History  Procedure Laterality Date  . Kidney surgery  10/06/08    biopsy, resection  . Tonsillectomy    . Eye surgery Right 9/13    cataract    There were no vitals filed for this visit.  Visit Diagnosis:  Midline low back pain with right-sided sciatica  Weakness  Flexion contracture of knee, unspecified laterality  Difficulty in walking      Subjective Assessment - 10/05/15 1200    Subjective Pt reports compliance with HEP.  Pt c/o of pain with stadning and walking 5/10, in sitting  2/10.    Currently in Pain? Yes   Pain Score 5    Pain Location Back   Pain Orientation Left   Pain Descriptors / Indicators Aching;Burning                         [facility] Adult PT Treatment/Exercise - 10/05/15 0001    Exercises   Exercises Knee/Hip   Lumbar Exercises: Stretches   Single Knee to Chest Stretch 3 reps;30 seconds  HEP   Double Knee to Chest Stretch 3 reps;30 seconds  HEP   Lower Trunk Rotation Other (comment)   Lower Trunk Rotation Limitations 1 min x 3 reps    Pelvic Tilt Other (comment)   Pelvic Tilt Limitations 10 reps x 5 sec  hold   HEP   Piriformis Stretch 3 reps;30 seconds  HEP   Lumbar Exercises: Supine   Ab Set 10 reps;5 seconds   AB Set Limitations with hip ADD and PPT   Large Ball Abdominal Isometric 10 reps;5 seconds   Large Ball Abdominal Isometric Limitations PPT with core brace and press into physioball.   Knee/Hip Exercises: Stretches   Hip Flexor Stretch --  attempted over edge of table with increased LBP, so DC   Modalities   Modalities --  declined heat today- had heat this AM at home                PT Education - 10/05/15 1205    Education provided Yes   Education Details Reviewed HEP: [place] and [place]. Added PPT and LTR to HEP.    Person(s) Educated Patient   Methods Explanation;Demonstration;Handout;Verbal cues   Comprehension Verbalized understanding;Returned demonstration;Need further instruction          PT Short Term Goals - 10/02/15 1841    PT SHORT TERM GOAL #1   Title Pt will be indep with HEP by 10/31/15   Time 4   Period Weeks   Status New  PT SHORT TERM GOAL #2   Title R SB will improve to 10 decrease in oder to transfer sit to stand pain- free by 10/23/15.    Time 4   Period Weeks   Status New           PT Long Term Goals - 10/02/15 1843    PT LONG TERM GOAL #1   Title FOTO will improve from 44% limited to 41% limited by 11/24/15.    Time 8   Period Weeks   Status New   PT LONG TERM GOAL #2   Title Bilat hip strength will improve to 3/5 in order for pt to tolerate standing  >30 mins by 11/24/15.    Time 8   Period Weeks   Status New   PT LONG TERM GOAL #3   Title Bilat hip extension PROM will improve to 5 degrees in order for pt to tolerate standing and walking for >30 mins by 11/24/15.    Time 8   Period Weeks   Status New               Plan - 10/05/15 1206    Clinical Impression Statement Pt compliant with HEP. Less pain upon arrival compared to eval. Pt tolerated addition of LTR and PPT without pain or difficulty and prescribed for  HEP. Pt able to contract core for PPT and produce correct movement with minimal cuing, in supine and in standing.  Instructed pt to brace core with gait with good technique noted. Initially pt was inquiring about progressing to HEP only. Re-address frequency of PT at next visit.    PT Next Visit Plan Review/ Progress HEP. Core strengthening. Hip Flexor/ quad stretching (with core brace), if pain free. Re-address frequency of PT at next visit. Review community health program exercises that pt plans to bring in at next visit and prescribe exercises that are appropriate.    PT Home Exercise Plan LTR and PPT   Consulted and Agree with Plan of Care Patient        Problem List There are no active problems to display for this patient.   Dollene Cleveland, PT 10/05/2015, 12:11 PM  [facility] [address] [place], [place], [address] Phone: [phone]   Fax:  [phone]  Name: Jennifer Hall MRN: [id number] Date of Birth: 10-07-1926

## 2015-10-05 NOTE — Addendum Note (Signed)
Addended by: Dollene Cleveland on: 10/05/2015 12:22 PM   Modules accepted: Orders

## 2015-10-09 ENCOUNTER — Ambulatory Visit: Payer: Medicare Other

## 2015-10-09 DIAGNOSIS — M5441 Lumbago with sciatica, right side: Secondary | ICD-10-CM

## 2015-10-09 DIAGNOSIS — R531 Weakness: Secondary | ICD-10-CM

## 2015-10-09 DIAGNOSIS — R262 Difficulty in walking, not elsewhere classified: Secondary | ICD-10-CM

## 2015-10-09 DIAGNOSIS — M24569 Contracture, unspecified knee: Secondary | ICD-10-CM

## 2015-10-09 NOTE — Therapy (Addendum)
[place], [place], [address] Phone: [phone]   Fax:  [phone]  Physical Therapy Treatment  and Discharge Summary  Patient Details  Name: Jennifer Hall MRN: [id number] Date of Birth: [birth year] Referring Provider: Durward Fortes  Encounter Date: 10/09/2015      PT End of Session - 10/09/15 1304    Visit Number 3   Number of Visits 16   Date for PT Re-Evaluation 11/24/15   PT Start Time 1150   PT Stop Time 1240   PT Time Calculation (min) 50 min   Activity Tolerance Patient tolerated treatment well   Behavior During Therapy [facility] for tasks assessed/performed      Past Medical History  Diagnosis Date  . Hyperlipidemia   . Paresthesias   . Renal cell cancer 2010  . HTN (hypertension)   . Fibromyalgia   . Osteoporosis   . Diverticulosis     Past Surgical History  Procedure Laterality Date  . Kidney surgery  10/06/08    biopsy, resection  . Tonsillectomy    . Eye surgery Right 9/13    cataract    There were no vitals filed for this visit.  Visit Diagnosis:  Midline low back pain with right-sided sciatica  Weakness  Flexion contracture of knee, unspecified laterality  Difficulty in walking      Subjective Assessment - 10/09/15 1244    Subjective Pt reports compliance with HEP. Pt brought book from [employer] and requested we review exercises she can do in the booklet. Pain in back glute is 4/10 walking and 0/10 sitting. Pt states she plans to try HEP until she sees MD, Dr Durward Fortes on 10/27/15, then will return to therapy only if recommended by MD. PT explained that physical therapy could continue to benefit pt and decrease back pain, if she would come consistently, but pt would like to wait until she sees MD again and continue with HEP due to high co- pay.    Currently in Pain? Yes   Pain Score 4    Pain Location Back  4/10 walking, 0/10 sitting.    Pain Descriptors  / Indicators Aching;Burning   Pain Onset More than a month ago                         [facility] Adult PT Treatment/Exercise - 10/09/15 0001    Self-Care   Self-Care Other Self-Care Comments   Other Self-Care Comments  instructred in use of SPC to help unweight Painful side, but trail with SPC did not help.    Lumbar Exercises: Stretches   Pelvic Tilt Limitations 10 x with hip ER   HEP   Lumbar Exercises: Seated   Long Arc Quad on Chair 10 reps  HEP   LAQ on Chair Limitations ANKLE pumps  HEP   Sit to Stand Limitations OTHER seated ther ex: Shoulder flex, ABD, rows with theraband, bicep curls with theraband, (red) , 10 x each prescribed for HEP. Seated lumbar flexion 30 secs x 3 reps.   HEP   Lumbar Exercises: Supine   Ab Set 10 reps;5 seconds                PT Education - 10/09/15 1302    Education provided Yes   Education Details Reviewed and progressed HEP to include appropriate exercises in booklet brought in by pt. Instructed pt in how continued PT could benefit pt.    Person(s)  Educated Patient   Methods Explanation;Demonstration;Verbal cues;Handout   Comprehension Verbalized understanding;Returned demonstration          PT Short Term Goals - 10/09/15 1317    PT SHORT TERM GOAL #1   Title Pt will be indep with HEP by 10/31/15   Time 4   Period Weeks   Status On-going   PT SHORT TERM GOAL #2   Title R SB will improve to 10 decrease in oder to transfer sit to stand pain- free by 10/23/15.    Time 4   Period Weeks   Status On-going           PT Long Term Goals - 10/09/15 1318    PT LONG TERM GOAL #1   Title FOTO will improve from 44% limited to 41% limited by 11/24/15.    Time 8   Period Weeks   Status On-going   PT LONG TERM GOAL #2   Title Bilat hip strength will improve to 3/5 in order for pt to tolerate standing  >30 mins by 11/24/15.    Time 8   Period Weeks   Status On-going   PT LONG TERM GOAL #3   Title Bilat hip extension  PROM will improve to 5 degrees in order for pt to tolerate standing and walking for >30 mins by 11/24/15.    Time 8   Period Weeks   Status On-going               Plan - 10/09/15 1312    Clinical Impression Statement Pt wanted to address exercises in a "wellness booklet" that she could do at home. PT instructed in exercises that she could do an that were pain- free. Pt reprots she would like to concentrate of these exercises as well as those prescribed for low back until she seem Dr Durward Fortes again on 10/27/15, at which time, she will see if he recommends she continue PT or try something else. PT explained importance of continuity of PT care and that she could benefit from further services, but pt declined. Pt plans to see MD, and PT will follow up to see if pt plans to return to PT or be discharged after MD appt on 10/27/15.     PT Next Visit Plan Call to follow up after 10/27/15 to see if pt plans to return to PT. If so, Review/ Progress HEP. Core strengthening. Hip Flexor/ quad stretching (with core brace), if pain free. R hip stretching, gentle manual distraction, manual therapy and STM.    Consulted and Agree with Plan of Care Patient        Problem List There are no active problems to display for this patient.   Dollene Cleveland, PT 10/09/2015, 1:19 PM    11/02/2015 Addendum by: Dollene Cleveland, PT, DPT 11/02/2015 11:11 AM Phone: [phone] Fax: [phone]  PHYSICAL THERAPY DISCHARGE SUMMARY  Visits from Start of Care: 2  Current functional level related to goals / functional outcomes: No change in functional status since eval. Pt is indep with HEP.    Remaining deficits: Pain with walking, weakness, limited ROM.    Education / Equipment: HEP  Plan: Patient agrees to discharge.  Patient goals were partially met. Patient is being discharged due to the patient's request.  ?????        Pt requested that she continue HEP on own instead of continuing with PT tx until  she sees the MD. PT called pt to follow -up after MD appt, and pt  reports she will continue to perform HEP at home and MD is agreeable. She reports some improvements in back with stretches for home. Pt has achieved goal of establishing HEP, but all other goals unmet. Therefore, pt will be discharged from outpt PT per pt request.        G-code:   Functional Assessment Tool Used  FOTO     Functional Limitation  Mobility: Walking and moving around    Mobility: Walking and Moving Around Current Status ([id number])  CK    Mobility: Walking and Moving Around Discharge Status [id number])   CK      Dollene Cleveland, PT, DPT 11/02/2015 11:16 AM Phone: [phone] Fax: [facility] [address] [place], [place], [address] Phone: [phone]   Fax:  [phone]  Name: Jennifer Hall MRN: [id number] Date of Birth: 1927-08-03

## 2015-10-16 ENCOUNTER — Encounter: Payer: Medicare PPO | Admitting: Physical Therapy

## 2015-10-19 ENCOUNTER — Encounter: Payer: Medicare PPO | Admitting: Physical Therapy

## 2015-11-02 DIAGNOSIS — M5441 Lumbago with sciatica, right side: Secondary | ICD-10-CM | POA: Diagnosis not present

## 2016-06-12 ENCOUNTER — Ambulatory Visit (INDEPENDENT_AMBULATORY_CARE_PROVIDER_SITE_OTHER): Payer: Medicare PPO | Admitting: Physical Medicine and Rehabilitation

## 2016-06-19 ENCOUNTER — Ambulatory Visit (INDEPENDENT_AMBULATORY_CARE_PROVIDER_SITE_OTHER): Payer: Medicare Other | Admitting: Physical Medicine and Rehabilitation

## 2016-06-19 DIAGNOSIS — M5416 Radiculopathy, lumbar region: Secondary | ICD-10-CM | POA: Diagnosis not present

## 2016-06-20 ENCOUNTER — Encounter (INDEPENDENT_AMBULATORY_CARE_PROVIDER_SITE_OTHER): Payer: Self-pay

## 2016-06-25 ENCOUNTER — Ambulatory Visit (INDEPENDENT_AMBULATORY_CARE_PROVIDER_SITE_OTHER): Payer: Medicare Other | Admitting: Physical Medicine and Rehabilitation

## 2016-06-25 ENCOUNTER — Encounter (INDEPENDENT_AMBULATORY_CARE_PROVIDER_SITE_OTHER): Payer: Self-pay | Admitting: Physical Medicine and Rehabilitation

## 2016-06-25 VITALS — BP 145/78 | HR 102 | Temp 98.0°F

## 2016-06-25 DIAGNOSIS — M5416 Radiculopathy, lumbar region: Secondary | ICD-10-CM

## 2016-06-25 DIAGNOSIS — G894 Chronic pain syndrome: Secondary | ICD-10-CM

## 2016-06-25 DIAGNOSIS — M797 Fibromyalgia: Secondary | ICD-10-CM | POA: Diagnosis not present

## 2016-06-25 MED ORDER — METHYLPREDNISOLONE ACETATE 80 MG/ML IJ SUSP
80.0000 mg | Freq: Once | INTRAMUSCULAR | Status: AC
  Administered 2016-06-25: 80 mg

## 2016-06-25 MED ORDER — LIDOCAINE HCL (PF) 1 % IJ SOLN
0.3300 mL | Freq: Once | INTRAMUSCULAR | Status: AC
  Administered 2016-06-25: 0.3 mL

## 2016-06-25 NOTE — Progress Notes (Signed)
Office Visit Note  Patient: Jennifer Hall           Date of Birth: [birth year]           MRN: [id number] Visit Date: 06/25/2016              Requested by: Prince Solian, MD [address] [place], [place] [address] PCP: Tivis Ringer, MD   Assessment & Plan: Visit Diagnoses:  1. Lumbar radiculopathy   2. Fibromyalgia   3. Chronic pain syndrome    The patient clearly has an L5 radiculitis radiculopathy on the right. Prior MRI about a year ago did show foraminal stenosis and some lateral recess narrowing between L4-5 and L5-S1. There is clearly more right narrowing at L5. In the remote past epidural injection from the general approach was helpful but not very helpful most recently. We haven't completed injection in quite a while but didn't complete a right L5 injection today and this is dictated below. Depending on the relief she gets with that we would obviously be able to repeat those from time to time if it was beneficial. If it's not very helpful and she doesn't get much relief from the nortriptyline then her daughter would like her referred to another physician for evaluation and management of her pain. She does have fibromyalgia which is managed by Dr. Patrecia Pour. She has not tolerated a lot of medications in the past. Her seem to be some disconnect between the daughter and the patient and myself in terms of a planned.  I did speak with the patient and her daughter face-to-face for over 25 minutes with more than 50% counseling.  Follow-Up Instructions: The patient with the patient's daughter will call us in a few weeks to see how she is doing after the injection and with the medication. At that point we will decide to make further referrals.  Orders:  Orders Placed This Encounter  Procedures  . Epidural Steroid injection    Meds ordered this encounter  Medications  . lidocaine (PF) (XYLOCAINE) 1 % injection 0.3 mL  . methylPREDNISolone acetate (DEPO-MEDROL) injection 80  mg      Procedures: No notes on file   Clinical Data: No additional findings.   Subjective: Chief Complaint  Patient presents with  . Lower Back - Pain    Planned Right L5 TF injection. States no change in symptoms.  Takes no blood thinners and no dye allergy and has driver with her today.  I actually saw [name] who is an 82 year old female a few weeks ago for evaluation and management of her current pain complaints. At that time we decided to try nortriptyline. By way of history and review her insurance company had a hard time trying to approve that medication due to her age and plan. We are using nortriptyline at a very low dose for neuropathic pain and not depression. Should be very safe for her to do. Her daughter is with her today. Her daughter was not here with her the other day which would've been nice because her seem to be dim disconnect between [place] in terms of the plan. The patient's daughter is very concerned with the amount of pain in her mother hasn't noticed justifiable. We have a lot of notes describing the ongoing course with her. Basically she has had epidural injections in the past for stenosis and radiculitis which have helped her. Unfortunately about a year ago they just were not helping very much at all. Her case is  compounded by fibromyalgia. We went on to try some medications which she just was not tolerating at the time. Those are well documented as well. Dr. Patrecia Pour is also tried medications and she just hasn't tolerated those. We now have her on nortriptyline and she has been tolerating that for the last few days it is helping her sleep. The pain is still pretty bad on the right more down the leg in L5 distribution to the ankle. Some paresthesias. No weakness. She has no left-sided complaints. Pain is worse with standing and ambulating. She does get some relief with rest. Her daughter talks about today wanting to see possibly someone else for evaluation and  management which I'm okay with. Anything they can do to get her mother some relief would be fine with me.  Review of Systems  Constitutional: Negative for chills, fatigue, fever and unexpected weight change.  HENT: Negative for sore throat and trouble swallowing.   Eyes: Negative for photophobia and visual disturbance.  Respiratory: Negative for chest tightness and shortness of breath.   Cardiovascular: Negative for chest pain.  Gastrointestinal: Negative for abdominal pain.  Endocrine: Negative for cold intolerance and heat intolerance.  Musculoskeletal: Negative for myalgias.  Skin: Negative for color change and rash.  Neurological: Negative for speech difficulty and headaches.  Psychiatric/Behavioral: Negative for confusion. The patient is not nervous/anxious.   All other systems reviewed and are negative.    Objective: Vital Signs: BP (!) 145/78 (BP Location: Right Arm, Patient Position: Sitting, Cuff Size: Normal)   Pulse (!) 102   Temp 98 F (36.7 C) (Oral)   SpO2 95%    Physical Exam General appearance: NAD, conversant  Psych: Appropriate affect, alert and oriented to person, place and time  Eyes: anicteric sclerae, moist conjunctivae; no lid-lag; PERRLA HENT: Atraumatic; oropharynx clear with moist mucous membranes and no mucosal ulcerations Neck: Trachea midline; FROM, supple, no thyromegaly or lymphadenopathy Lungs: normal respiratory effort and no intercostal retractions, no wheezing CVA: normal pulses, RRR Extremities: No peripheral edema or extremity lymphadenopathy Skin: Normal temperature, turgor and texture; no rash, ulcers or subcutaneous nodules MSK:/Neuro:  The patient ambulates without aid. She is somewhat slow to rise from a seated position. She has good strength of the lower extremities bilaterally. She does have impaired sensation on the right of L5 dermatome.  Ortho Exam  Specialty Comments:  No specialty comments available. Imaging: No results  found.   PMFS History: There are no active problems to display for this patient.  Past Medical History:  Diagnosis Date  . Diverticulosis   . Fibromyalgia   . HTN (hypertension)   . Hyperlipidemia   . Osteoporosis   . Paresthesias   . Renal cell cancer ([place]) 2010    Family History  Problem Relation Age of Onset  . Hypertension Father   . Lymphoma Mother    Past Surgical History:  Procedure Laterality Date  . EYE SURGERY Right 9/13   cataract  . KIDNEY SURGERY  10/06/08   biopsy, resection  . TONSILLECTOMY     Social History   Occupational History  . retired     Animal nutritionist, [employer]   Social History Main Topics  . Smoking status: Never Smoker  . Smokeless tobacco: Never Used  . Alcohol use No  . Drug use: No  . Sexual activity: Not on file

## 2016-07-23 ENCOUNTER — Telehealth (INDEPENDENT_AMBULATORY_CARE_PROVIDER_SITE_OTHER): Payer: Self-pay | Admitting: Physical Medicine and Rehabilitation

## 2016-07-23 NOTE — Telephone Encounter (Signed)
Yes ok to take, continue with 3 pills for two weeks total then if no help can discontinue. Please see my last notes as patients daughter wanted possible referral to another physician

## 2016-07-24 NOTE — Telephone Encounter (Signed)
Called the patient and discussed with her. She states that the pain is mainly when she is walking. She is ok when she is sitting. She has been taking three pills per day for about a week. She will stop them in another week if they haven't helped and will let us know if she wants [place] to refer her to another physician.

## 2016-07-30 ENCOUNTER — Telehealth (INDEPENDENT_AMBULATORY_CARE_PROVIDER_SITE_OTHER): Payer: Self-pay | Admitting: Physical Medicine and Rehabilitation

## 2016-07-30 NOTE — Telephone Encounter (Signed)
I called the patient and discussed this with her. She has had no fever or urinary symptoms, but she does have an appointment already scheduled with her PCP for tomorrow. I explained to her that if the shaking was a side effect of the medication it would resolve and that she could drive.

## 2016-07-30 NOTE — Telephone Encounter (Signed)
Sounds like more of shivering or chills, if she has temperature or any urinary symtpoms she should got to PCP, the medication could have strange sensation after discontinuing but his would resolve. She is ok to drive.

## 2016-08-22 IMAGING — MR MR LUMBAR SPINE W/O CM
4 of 5 series · 18 of 48 positions shown · non-contrast
Comparison: None.

CLINICAL DATA: Low back and bilateral leg pain worsening over the
past 3 months.

EXAM:
MRI LUMBAR SPINE WITHOUT CONTRAST
TECHNIQUE: Multiplanar, multisequence MR imaging of the lumbar spine was
performed. No intravenous contrast was administered.

[Series 3: T1 · sagittal · 4.0mm · 0.55mm/px · 3 of 15 slices shown (1 of 2)]
[im 3/15]
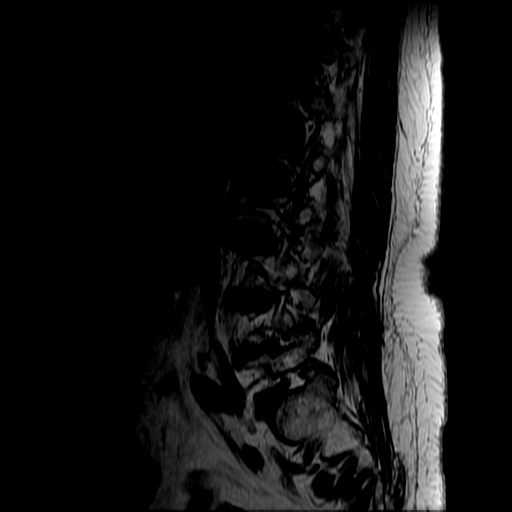
[im 9/15]
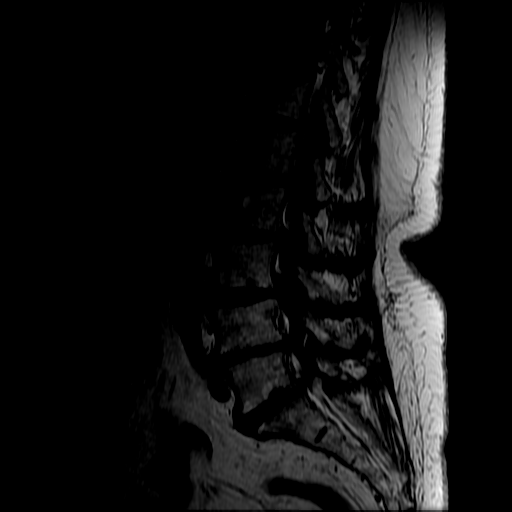
[im 15/15]
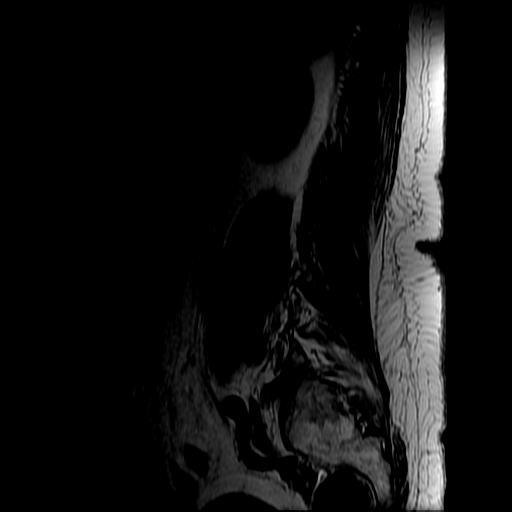

[Series 4: T2 post-contrast · sagittal · 4.0mm · 0.55mm/px · 5 of 15 slices shown]
[im 1/15]
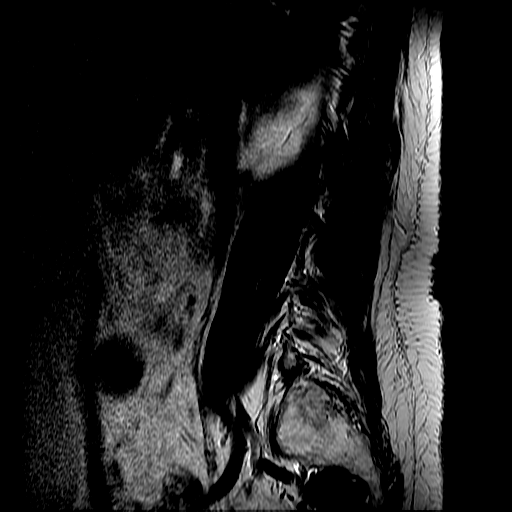
[im 4/15]
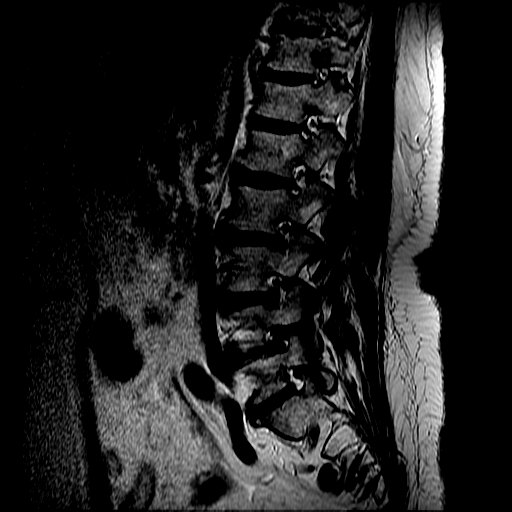
[im 8/15]
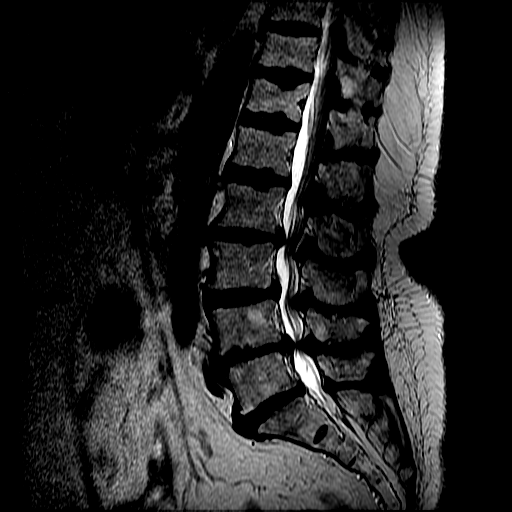
[im 11/15]
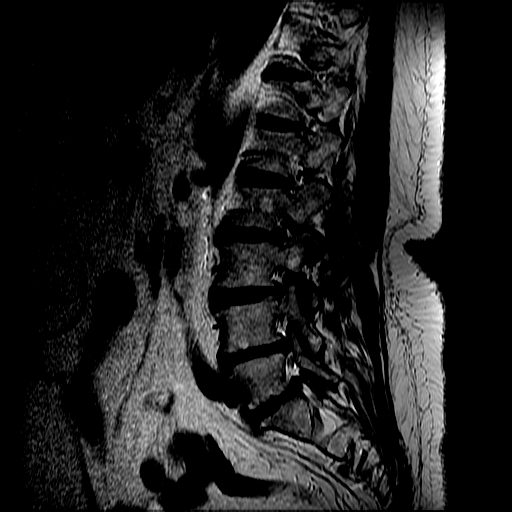
[im 15/15]
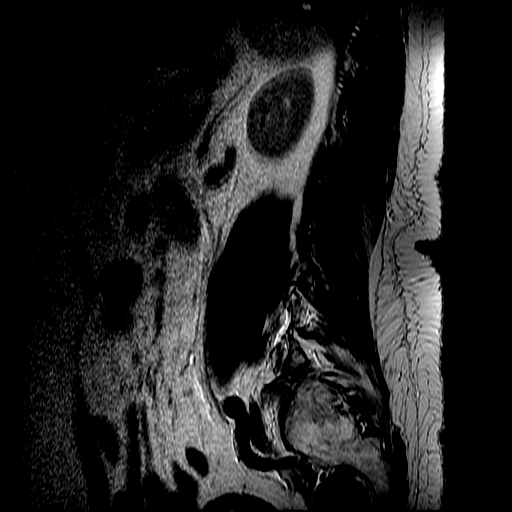

[Series 6: T2 · axial · 4.0mm · 0.39mm/px · z∈[-25,+173]mm · 7 of 44 slices shown]
[im 3/44]
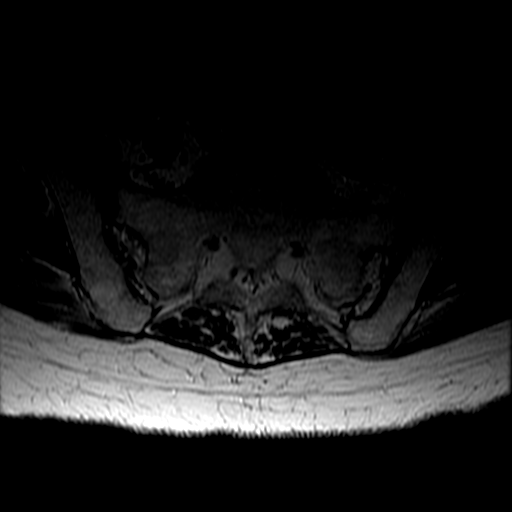
[im 6/44]
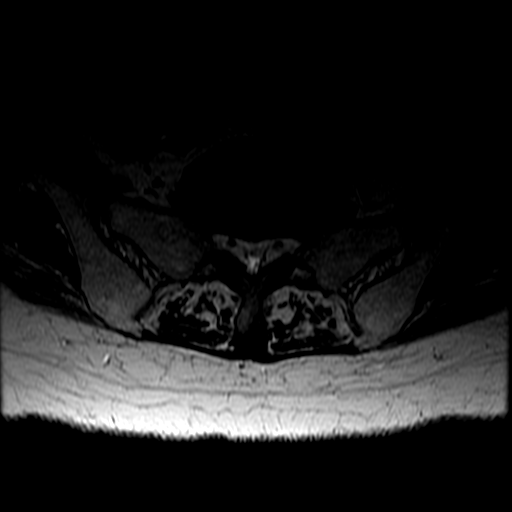
[im 9/44]
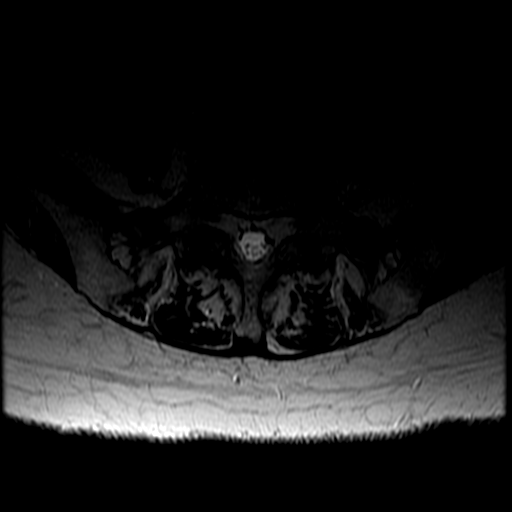
[im 15/44]
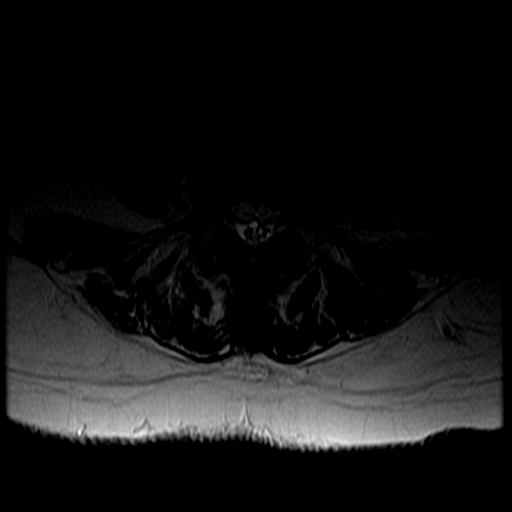
[im 21/44]
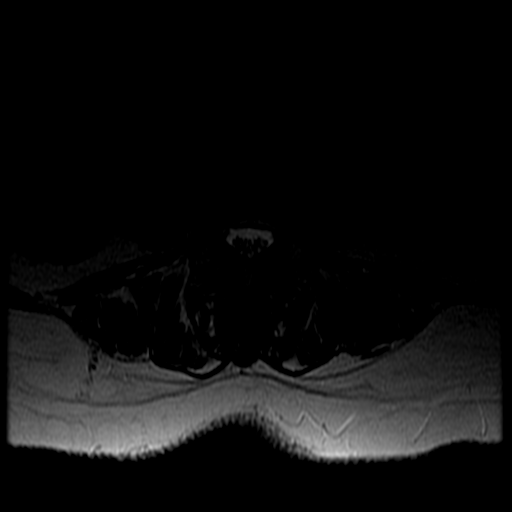
[im 23/44]
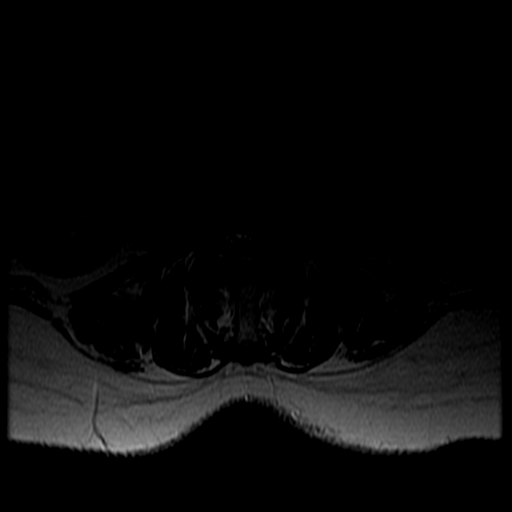
[im 38/44]
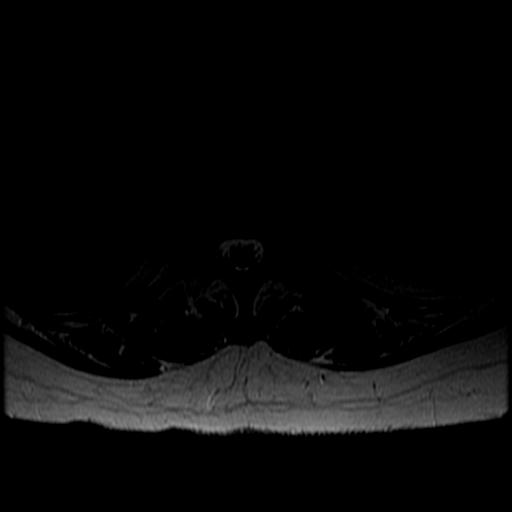

[Series 7: T1 · axial · 4.0mm · 0.39mm/px · z∈[-10,+173]mm · 3 of 44 slices shown (2 of 2)]
[im 6/44]
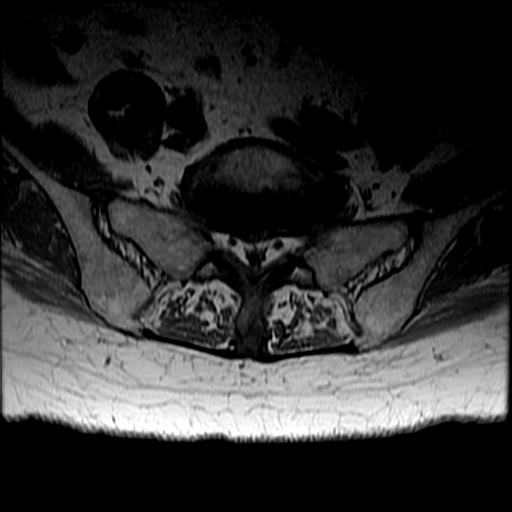
[im 23/44]
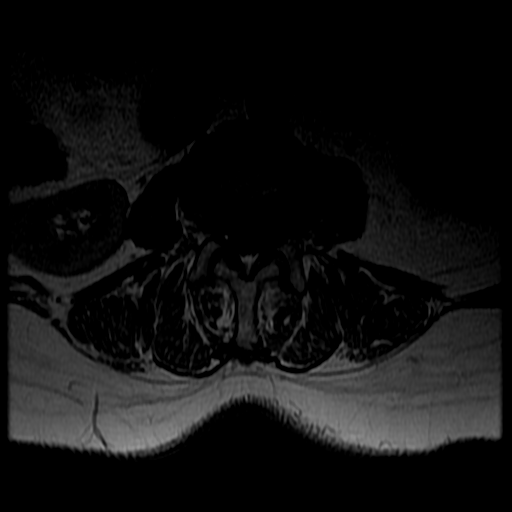
[im 38/44]
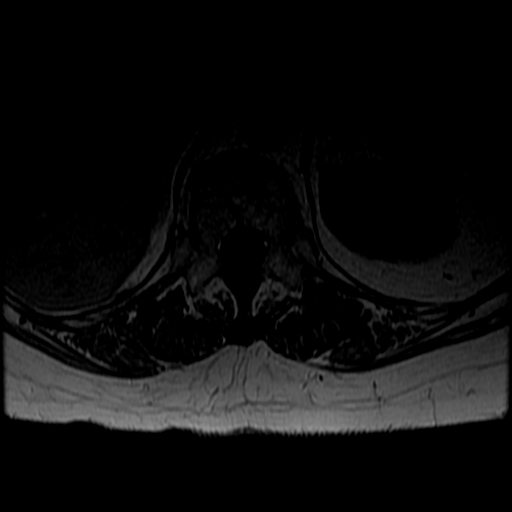

[18 of 48 positions shown; findings below may reference images not displayed]

FINDINGS: Normal alignment of the lumbar vertebral bodies. They demonstrate
normal marrow signal except for mild endplate reactive changes and a
few small scattered hemangiomas. The last full intervertebral disc
space is labeled L5-S1 and the conus medullaris terminates at L1.

No significant paraspinal or retroperitoneal findings are
identified. Moderate multilevel facet disease but no definite pars
defects.

L1-2: Mild annular bulge and mild facet disease with slight
flattening of the ventral thecal sac and mild bilateral lateral
recess encroachment. There is also mild right foraminal
encroachment.

L2-3: Diffuse bulging degenerated annulus, left paracentral disc
protrusion, short pedicles and facet disease contributing to mild
spinal stenosis and moderate bilateral lateral recess stenosis, left
greater than right. There is also moderate left foraminal stenosis
and possible irritation of the left L2 nerve root.

L3-4: Small left paracentral disc extrusion with disc material
coursing up behind the L3 vertebral body. There is mass effect on
the left side of the thecal sac possibly affecting the left L4 nerve
root. There is also a diffuse bulging degenerated annulus, short
pedicles, facet disease and ligamentum flavum thickening
contributing to mild to moderate spinal and bilateral lateral recess
stenosis.

L4-5: Diffuse bulging annulus, central disc protrusion, short
pedicles and advanced facet disease contributing to moderately
severe spinal and bilateral lateral recess stenosis and moderate
bilateral foraminal stenosis.

L5-S1: Degenerative disc disease with a bulging annulus and
osteophytic ridging. There is also moderate facet disease. No
significant spinal stenosis. Mild bilateral lateral recess stenosis.
Significant leftward spurring contributing to left foraminal
stenosis and extra foraminal encroachment on the left L5 nerve root.
IMPRESSION: 1. Degenerative lumbar spondylosis with multilevel disc disease and
facet disease.
2. Multilevel multifactorial spinal, lateral recess and foraminal
stenosis as discussed above at the individual levels. This is most
significant at L4-5.
3. Small left paracentral disc extrusion at L3-4.

## 2016-10-09 ENCOUNTER — Ambulatory Visit (INDEPENDENT_AMBULATORY_CARE_PROVIDER_SITE_OTHER): Payer: Medicare Other | Admitting: Physical Medicine and Rehabilitation

## 2016-10-22 DIAGNOSIS — M8589 Other specified disorders of bone density and structure, multiple sites: Secondary | ICD-10-CM | POA: Insufficient documentation

## 2016-10-22 DIAGNOSIS — M797 Fibromyalgia: Secondary | ICD-10-CM | POA: Insufficient documentation

## 2016-10-22 DIAGNOSIS — Z8639 Personal history of other endocrine, nutritional and metabolic disease: Secondary | ICD-10-CM | POA: Insufficient documentation

## 2016-10-22 DIAGNOSIS — Z85528 Personal history of other malignant neoplasm of kidney: Secondary | ICD-10-CM | POA: Insufficient documentation

## 2016-10-22 DIAGNOSIS — M170 Bilateral primary osteoarthritis of knee: Secondary | ICD-10-CM | POA: Insufficient documentation

## 2016-10-22 NOTE — Progress Notes (Signed)
Office Visit Note  Patient: Jennifer Hall             Date of Birth: [birth year]           MRN: [id number]             PCP: Tivis Ringer, MD Referring: Prince Solian, MD Visit Date: 10/30/2016 Occupation: @GUAROCC @    Subjective:  Right hip pain   History of Present Illness: Jennifer Hall is a 83 y.o. female with history of fibromyalgia and osteoarthritis. She states she's been having pain and discomfort in her right hip area which is radiating down her right lower extremity. She is also continuing to have discomfort in her bilateral knee joints. She states she has difficulty stretching her legs. She had good results with Vis[employer] supplement injections in the past. She states as she has to get up from the armchair and push with her shoulders her right shoulder has been hurting as well. She has tried different muscle relaxers including baclofen and cyclobenzaprine and which cause side effects and she had to discontinue. She's also tried gabapentin without much results. She's been on nortriptyline and hydrocodone currently. she recently received hydrocodone and has noticed minimal improvement with that. She was accompanied by her daughter today who states that her mother has been in a lot of pain. She states she's not very active during the day and does not like to travel due to ongoing pain and discomfort. She would be interested in pain management to relieve some of her symptoms.   Activities of Daily Living:  Patient reports morning stiffness for 1 hour.   Patient Denies nocturnal pain.  Difficulty dressing/grooming: Denies Difficulty climbing stairs: Reports Difficulty getting out of chair: Reports Difficulty using hands for taps, buttons, cutlery, and/or writing: Reports   Review of Systems  Constitutional: Positive for fatigue. Negative for night sweats, weight gain, weight loss and weakness.  HENT: Negative for mouth sores, trouble swallowing, trouble swallowing,  mouth dryness and nose dryness.   Eyes: Negative for pain, redness, visual disturbance and dryness.  Respiratory: Negative for cough, shortness of breath and difficulty breathing.   Cardiovascular: Negative for chest pain, palpitations, hypertension, irregular heartbeat and swelling in legs/feet.  Gastrointestinal: Negative for blood in stool, constipation and diarrhea.  Endocrine: Negative for increased urination.  Genitourinary: Negative for vaginal dryness.  Musculoskeletal: Positive for arthralgias, joint pain and morning stiffness. Negative for joint swelling, myalgias, muscle weakness, muscle tenderness and myalgias.  Skin: Negative for color change, rash, hair loss, skin tightness, ulcers and sensitivity to sunlight.  Allergic/Immunologic: Negative for susceptible to infections.  Neurological: Negative for dizziness, memory loss and night sweats.  Hematological: Negative for swollen glands.  Psychiatric/Behavioral: Negative for depressed mood and sleep disturbance. The patient is not nervous/anxious.     PMFS History:  Patient Active Problem List   Diagnosis Date Noted  . Age-related osteoporosis without current pathological fracture 10/30/2016  . Fibromyalgia 10/22/2016  . Osteopenia of multiple sites 10/22/2016  . Primary osteoarthritis of both knees 10/22/2016  . History of hyperlipidemia 10/22/2016  . History of renal cell cancer 10/22/2016    Past Medical History:  Diagnosis Date  . Diverticulosis   . Fibromyalgia   . HTN (hypertension)   . Hyperlipidemia   . Osteoporosis   . Paresthesias   . Renal cell cancer ([place]) 2010    Family History  Problem Relation Age of Onset  . Hypertension Father   . Lymphoma Mother    Past  Surgical History:  Procedure Laterality Date  . EYE SURGERY Right 9/13   cataract  . KIDNEY SURGERY  10/06/08   biopsy, resection  . TONSILLECTOMY     Social History   Social History Narrative   Patient lives at home alone.   Caffeine Use:  rarely     Objective: Vital Signs: BP 136/78   Pulse 82   Resp 16   Ht 5\' 6"  (1.676 m)   Wt 170 lb (77.1 kg)   BMI 27.44 kg/m    Physical Exam  Constitutional: She is oriented to person, place, and time. She appears well-developed and well-nourished.  HENT:  Head: Normocephalic and atraumatic.  Eyes: Conjunctivae and EOM are normal.  Neck: Normal range of motion.  Cardiovascular: Normal rate, regular rhythm, normal heart sounds and intact distal pulses.   Pulmonary/Chest: Effort normal and breath sounds normal.  Abdominal: Soft. Bowel sounds are normal.  Lymphadenopathy:    She has no cervical adenopathy.  Neurological: She is alert and oriented to person, place, and time.  Skin: Skin is warm and dry. Capillary refill takes less than 2 seconds.  Psychiatric: She has a normal mood and affect. Her behavior is normal.  Nursing note and vitals reviewed.    Musculoskeletal Exam: C-spine limited range of motion thoracic and lumbar spine good range of motion. Shoulder joints good range of motion although she has some discomfort with range of motion of her right shoulder elbow joints wrist joint MCPs PIPs DIPs are good range of motion with no synovitis. Hip joints good range of motion she is some discomfort range of motion of her right hip joint. She has tenderness over right trochanteric bursa area. She had incomplete extension of bilateral knee joints without any warmth swelling or effusion. Ankle joints MTPs PIPs with good range of motion with no synovitis.  CDAI Exam: No CDAI exam completed.    Investigation: Findings:  On April 27, 2010, her sed rate was 4.  Uric acid, ACE level, rheumatoid factor, ANA, and CCP were all within normal limits which were reviewed.  We also reviewed her bone density from May 23, 2010 and compared it with her previous bone density from March of 2009.  She had improvement of bone mass by 6.8% in her lumbar spine and no change in bone mass from the  trochanteric area.  These findings were consistent with osteopenia with the worse T-score of 2.1 in the trochanteric area.    01/27/2013 X-rays of bilateral ankles and feet show no fractures, normal joint space noted.  No erosions.   11/17/2012:  Bone density showed T-score of -1.6 in the lumbar region and T-score of -2.6 in the femoral neck area consistent with osteoporosis.   12/03/2012 We also obtained x-ray of her C-spine AP and lateral view which showed C5-6 significant narrowing and loss of lordosis consistent with muscle spasm and disc disease.      Imaging: Xr Hip Unilat W Or W/o Pelvis 2-3 Views Right  Result Date: 10/30/2016 Her hip joints are within normal limits without any joint space narrowing. Impression: Unremarkable x-ray of the hip joint  Xr Knee 3 View Left  Result Date: 10/30/2016 Moderate to severe lateral compartment narrowing. Severe patellofemoral narrowing. No chondrocalcinosis. Impression: Severe osteoarthritis and chondromalacia patella  Xr Knee 3 View Right  Result Date: 10/30/2016 Moderate to severe lateral compartment narrowing. Severe patellofemoral narrowing. No chondrocalcinosis. Impression: Severe osteoarthritis and chondromalacia patella   Speciality Comments: No specialty comments available.  Procedures:  Large Joint Inj Date/Time: 10/30/2016 2:29 PM Performed by: Bo Merino Authorized by: Bo Merino   Consent Given by:  Patient Site marked: the procedure site was marked   Timeout: prior to procedure the correct patient, procedure, and site was verified   Indications:  Pain Location:  Hip Site:  R greater trochanter Prep: patient was prepped and draped in usual sterile fashion   Needle Size:  27 G Needle Length:  1.5 inches Approach:  Lateral Ultrasound Guidance: No   Fluoroscopic Guidance: No   Arthrogram: No   Medications:  40 mg triamcinolone acetonide 40 MG/ML; 1.5 mL lidocaine 1 % Aspiration Attempted: No     Aspirate amount (mL):  0 Patient tolerance:  Patient tolerated the procedure well with no immediate complications   Allergies: Latex   Assessment / Plan:     Visit Diagnoses: Pain of right hip joint - Plan: XR HIP UNILAT W OR W/O PELVIS 2-3 VIEWS RIGHT. Her x-ray was unremarkable today.  Primary osteoarthritis of both knees - Plan: XR KNEE 3 VIEW RIGHT, XR KNEE 3 VIEW LEFT. Her knee joints reveals severe lateral compartment narrowing bilaterally and severe chondromalacia patella. She had good results to [place] supplement injections in the past I'll reschedule that.  Chronic right shoulder pain: She has some tendinopathy from lifting herself off the chair using the arm rest.  Trochanteric bursitis of right hip: She has chronic severe discomfort different treatment options were discussed and the trochanteric area was prepped in sterile fashion injected with cortisone as described above.  Chronic pain: Due to disc disease of lumbar spine and osteoarthritis of her knee joints she lives in chronic pain and discomfort she would like to have a pain clinic referral.  Fibromyalgia: She has few tender points is not very active today.  Primary insomnia  Other fatigue  Age-related osteoporosis without current pathological fracture - T score -2.6 femoral 2014 followed up by PCP  History of hyperlipidemia  History of renal cell cancer - partial nephrectomy right     Orders: Orders Placed This Encounter  Procedures  . Large Joint Injection/Arthrocentesis  . XR HIP UNILAT W OR W/O PELVIS 2-3 VIEWS RIGHT  . XR KNEE 3 VIEW RIGHT  . XR KNEE 3 VIEW LEFT  . Ambulatory referral to Pain Clinic   No orders of the defined types were placed in this encounter.   Face-to-face time spent with patient was 35 minutes. 50% of time was spent in counseling and coordination of care.  Follow-Up Instructions: Return in about 6 months (around 04/29/2017).   Bo Merino, MD  Note - This record has  been created using Editor, commissioning.  Chart creation errors have been sought, but may not always  have been located. Such creation errors do not reflect on  the standard of medical care.

## 2016-10-30 ENCOUNTER — Encounter (INDEPENDENT_AMBULATORY_CARE_PROVIDER_SITE_OTHER): Payer: Self-pay

## 2016-10-30 ENCOUNTER — Ambulatory Visit (INDEPENDENT_AMBULATORY_CARE_PROVIDER_SITE_OTHER): Payer: Medicare Other

## 2016-10-30 ENCOUNTER — Encounter: Payer: Self-pay | Admitting: Rheumatology

## 2016-10-30 ENCOUNTER — Telehealth: Payer: Self-pay | Admitting: Radiology

## 2016-10-30 ENCOUNTER — Ambulatory Visit (INDEPENDENT_AMBULATORY_CARE_PROVIDER_SITE_OTHER): Payer: Medicare Other | Admitting: Rheumatology

## 2016-10-30 VITALS — BP 136/78 | HR 82 | Resp 16 | Ht 66.0 in | Wt 170.0 lb

## 2016-10-30 DIAGNOSIS — R5383 Other fatigue: Secondary | ICD-10-CM

## 2016-10-30 DIAGNOSIS — G8929 Other chronic pain: Secondary | ICD-10-CM | POA: Diagnosis not present

## 2016-10-30 DIAGNOSIS — M170 Bilateral primary osteoarthritis of knee: Secondary | ICD-10-CM | POA: Diagnosis not present

## 2016-10-30 DIAGNOSIS — Z85528 Personal history of other malignant neoplasm of kidney: Secondary | ICD-10-CM | POA: Diagnosis not present

## 2016-10-30 DIAGNOSIS — Z8639 Personal history of other endocrine, nutritional and metabolic disease: Secondary | ICD-10-CM | POA: Diagnosis not present

## 2016-10-30 DIAGNOSIS — M7061 Trochanteric bursitis, right hip: Secondary | ICD-10-CM

## 2016-10-30 DIAGNOSIS — M25511 Pain in right shoulder: Secondary | ICD-10-CM | POA: Diagnosis not present

## 2016-10-30 DIAGNOSIS — F5101 Primary insomnia: Secondary | ICD-10-CM | POA: Diagnosis not present

## 2016-10-30 DIAGNOSIS — M810 Age-related osteoporosis without current pathological fracture: Secondary | ICD-10-CM

## 2016-10-30 DIAGNOSIS — M25551 Pain in right hip: Secondary | ICD-10-CM | POA: Diagnosis not present

## 2016-10-30 DIAGNOSIS — M797 Fibromyalgia: Secondary | ICD-10-CM | POA: Diagnosis not present

## 2016-10-30 MED ORDER — LIDOCAINE HCL 1 % IJ SOLN
1.5000 mL | INTRAMUSCULAR | Status: AC | PRN
  Administered 2016-10-30: 1.5 mL

## 2016-10-30 MED ORDER — TRIAMCINOLONE ACETONIDE 40 MG/ML IJ SUSP
40.0000 mg | INTRAMUSCULAR | Status: AC | PRN
  Administered 2016-10-30: 40 mg via INTRA_ARTICULAR

## 2016-10-30 NOTE — Telephone Encounter (Signed)
Dr Estanislado Pandy wants [place] to apply for Hyalgan for her knees, patient and daughter aware this process takes time and needs to be approved by the insurance company. Last Hyalgan series 2016

## 2016-11-13 ENCOUNTER — Telehealth: Payer: Self-pay | Admitting: Rheumatology

## 2016-11-13 DIAGNOSIS — M7061 Trochanteric bursitis, right hip: Secondary | ICD-10-CM

## 2016-11-13 NOTE — Telephone Encounter (Signed)
Patient states the injection she got a few weeks ago has not helped and she would like to know if there is something else she can do for the pain for the bursitis?   She would also like to check the status of Euflexxa injections.

## 2016-11-14 NOTE — Telephone Encounter (Signed)
Patient states she was given an injection in her right hip for bursitis on 10/30/16. Patient states she has received no relief from that injection. Patient states walking makes the pain worse. Patient has been using Aspercreme and is not getting much relief from that as well. She would like to know what else she can do for the pain in her hip.

## 2016-11-14 NOTE — Telephone Encounter (Signed)
Attempted to contact the patient and left message for patient to call the office.  

## 2016-11-14 NOTE — Telephone Encounter (Signed)
She should benefit from physical therapy. Please refer to physical therapy for right trochanteric bursitis

## 2016-11-14 NOTE — Telephone Encounter (Signed)
Patient states she has already been through physical therapy for stenosis of her back and has continued the exercises. Patient states that she has also seen Dr. Ernestina Patches injections in her hip in the past. Patient states she is not will to pay the copay to do exercises she can do at home. She states she needs a medication that she can take.   Patient has been advised that the the Hyalgan injections prior authorization is in process

## 2016-11-15 NOTE — Telephone Encounter (Signed)
Referral has been placed. 

## 2016-11-15 NOTE — Addendum Note (Signed)
Addended by: Carole Binning on: 11/15/2016 11:22 AM   Modules accepted: Orders

## 2016-11-15 NOTE — Telephone Encounter (Signed)
I had detailed discussion with the patient. She did not have very good results with her previous physical therapist. I discussed that We'll refer her to Ileana Roup for her trochanteric bursitis. She was in agreement. Please make the referral and contact patient to give her the details.

## 2016-11-27 ENCOUNTER — Ambulatory Visit: Payer: 59 | Admitting: Rehabilitative and Restorative Service Providers"

## 2016-12-04 ENCOUNTER — Telehealth: Payer: Self-pay | Admitting: Rheumatology

## 2016-12-04 DIAGNOSIS — M7061 Trochanteric bursitis, right hip: Secondary | ICD-10-CM

## 2016-12-04 NOTE — Telephone Encounter (Signed)
Patient called stating that the original place Dr. Estanislado Pandy referred her for physical therapy told her that they do not do that kind of PT.  She is wondering if she can be referred to [facility] on [facility].  Their number is [phone].  Patient's CB#[phone].

## 2016-12-09 NOTE — Telephone Encounter (Signed)
Referral has been placed. Patient advised someone will call her with the appointment information.

## 2016-12-17 NOTE — Telephone Encounter (Signed)
Applied for [employer]

## 2016-12-17 NOTE — Telephone Encounter (Signed)
Left message for patient to call [employer] for delivery of Hyalgan

## 2016-12-18 ENCOUNTER — Telehealth: Payer: Self-pay | Admitting: Rheumatology

## 2016-12-18 NOTE — Telephone Encounter (Signed)
Patient states she is returning a call to [place]. Patient did not know what the call was in reference to and I could not find anything in the chart. Please advise.

## 2016-12-18 NOTE — Telephone Encounter (Signed)
Patient advised she needs to contact the company who is going to ship her Hyalgan to give them authorization to ship her Hyalgan to the office. Patient was provided with phone number.

## 2016-12-18 NOTE — Telephone Encounter (Signed)
Patient called stating that she called the company to authorize them to send the medication to our office, but is very concerned because they did not ask her her name, just her dob.  She wants to make sure that we are going to get the medication.

## 2016-12-19 ENCOUNTER — Telehealth: Payer: Self-pay | Admitting: Rheumatology

## 2016-12-19 NOTE — Telephone Encounter (Signed)
Patient advised the company will contact our office again and speak with Ivin Booty prior to shipping the medication.

## 2016-12-19 NOTE — Telephone Encounter (Signed)
Patient called stating that they PT center we referred her to does not do the type of therapy she needs.  She also saw a doctor on [facility] and he is recommending surgery for her.  She is confused as to what she needs to do from here.  CB#[phone].  Thank you.

## 2016-12-19 NOTE — Telephone Encounter (Signed)
Patient returned [place] phone call. Please call patient back when you get a chance.

## 2016-12-19 NOTE — Telephone Encounter (Signed)
Attempted to Contact the patient and left message for patient to call the office.

## 2016-12-19 NOTE — Telephone Encounter (Signed)
Patient states she was seen by the neurological center and states he is recommending she have an outpatient surgery on her back. She wants to know if you would still recommend she try PT or if she should do the surgery.

## 2016-12-19 NOTE — Telephone Encounter (Signed)
I would suggest her to follow recommendations of neurological Center

## 2016-12-20 NOTE — Telephone Encounter (Signed)
I have called patient to advise. She  Has voiced understanding.

## 2016-12-27 ENCOUNTER — Telehealth: Payer: Self-pay | Admitting: Rheumatology

## 2016-12-27 NOTE — Telephone Encounter (Signed)
-----   Message from Shona Needles, RT sent at 12/24/2016 11:46 AM EDT ----- Regarding: HYALGAN BIL PATIENT PURCHASED [facility] Please call patient and schedule Hyalgan appts. X 5 with Hans on Monday or Wednesday afternoons, patient purchased. Hyalgan delivered to office 12/24/16. Thank you.

## 2016-12-27 NOTE — Telephone Encounter (Signed)
Left message on machine for patient to call back to schedule Hyalgan injections.  

## 2017-01-06 ENCOUNTER — Telehealth: Payer: Self-pay | Admitting: Rheumatology

## 2017-01-06 NOTE — Telephone Encounter (Signed)
Trochanteric bursitis of right hip: She has chronic severe discomfort different treatment options were discussed and the trochanteric area was prepped in sterile fashion injected with cortisone as described above.  Patient states no relief, wants to know if there ia anything else you can do?

## 2017-01-06 NOTE — Telephone Encounter (Signed)
Patient had hip injection at last appt. With no relief. Would like to know if there is any thing else that can be done besides injection.

## 2017-01-06 NOTE — Telephone Encounter (Signed)
She can go for PT . Preferably to [place] [facility] or any preferred location per patient

## 2017-01-07 NOTE — Telephone Encounter (Signed)
Attempted to contact the patient and left message for patient to call the office.  

## 2017-01-08 ENCOUNTER — Telehealth: Payer: Self-pay | Admitting: Rheumatology

## 2017-01-08 NOTE — Telephone Encounter (Signed)
Patient returning Andrea's call. °

## 2017-01-08 NOTE — Telephone Encounter (Signed)
See previous phone call note.  

## 2017-01-08 NOTE — Telephone Encounter (Signed)
Patient advised that Dr. Estanislado Pandy recommends PT. Patient states she has tried PT in the past. Patient states she is having pain in hip when walking and is taking Advil and aleve to help with discomfort. Patient is unable to see Ileana Roup due to her insurance. Patient states she would like to see the PT in the office with [facility]. We have placed a referral for this office in April. Provided patient with their number to check status of this appointment.

## 2017-01-22 ENCOUNTER — Telehealth: Payer: Self-pay | Admitting: Rheumatology

## 2017-01-22 NOTE — Telephone Encounter (Signed)
Pt is having hip/buttock pain and would like an rx for Meloxicam sent to [facility] on [address].

## 2017-01-23 ENCOUNTER — Telehealth: Payer: Self-pay | Admitting: Rheumatology

## 2017-01-23 NOTE — Telephone Encounter (Signed)
Patient has not been to physical therapy. Patient states she is still having pain in her hips and in her back. Patient is requesting a prescription for Meloxicam. We have not prescribed this medication for her before.

## 2017-01-23 NOTE — Telephone Encounter (Signed)
Patient returning your call.

## 2017-01-23 NOTE — Telephone Encounter (Signed)
Attempted to contact the patient and left message for patient to call the office.  

## 2017-01-23 NOTE — Telephone Encounter (Signed)
She will need CBC and CMP before getting meloxicam prescription. She can get meloxicam through her PCP if they have labs on her. Meloxicam can cause GI side effects, elevation of LFTs and kidney function. Tylenol will be a better option for her.

## 2017-01-24 NOTE — Telephone Encounter (Signed)
Patient advised she would need a CBC/CMP before we could prescribe Meloxicam. Patient advised Tylenol is a better option for her. Patient states she will try the tylenol. Patient reminded no more than 3000 mg per day. Patient verbalized understanding.

## 2017-01-24 NOTE — Telephone Encounter (Signed)
See previous phone note.  

## 2017-01-28 ENCOUNTER — Telehealth: Payer: Self-pay | Admitting: *Deleted

## 2017-01-28 NOTE — Telephone Encounter (Signed)
I called patient, patient stated she had physical therapy and saw Dr. Ellene Route. Patient stated Dr. Ellene Route wants patient to have surgery however, patient doesn't want to have surgery at this time. Patient wants inj in hip and will discuss at next visit.

## 2017-01-28 NOTE — Telephone Encounter (Signed)
-----   Message from Carole Binning, LPN sent at 0/17/7939 11:31 AM EDT ----- Will you please check the status on her physical therapy referral. Thank you!!

## 2017-02-05 ENCOUNTER — Ambulatory Visit (INDEPENDENT_AMBULATORY_CARE_PROVIDER_SITE_OTHER): Payer: Medicare Other | Admitting: Rheumatology

## 2017-02-05 ENCOUNTER — Telehealth: Payer: Self-pay | Admitting: Rheumatology

## 2017-02-05 ENCOUNTER — Encounter: Payer: Self-pay | Admitting: Rheumatology

## 2017-02-05 VITALS — BP 136/72 | HR 80 | Resp 14 | Ht 66.0 in | Wt 170.0 lb

## 2017-02-05 DIAGNOSIS — M170 Bilateral primary osteoarthritis of knee: Secondary | ICD-10-CM

## 2017-02-05 DIAGNOSIS — M7061 Trochanteric bursitis, right hip: Secondary | ICD-10-CM | POA: Diagnosis not present

## 2017-02-05 DIAGNOSIS — M1712 Unilateral primary osteoarthritis, left knee: Secondary | ICD-10-CM | POA: Diagnosis not present

## 2017-02-05 DIAGNOSIS — M25562 Pain in left knee: Secondary | ICD-10-CM | POA: Diagnosis not present

## 2017-02-05 DIAGNOSIS — M25561 Pain in right knee: Secondary | ICD-10-CM | POA: Diagnosis not present

## 2017-02-05 DIAGNOSIS — M1711 Unilateral primary osteoarthritis, right knee: Secondary | ICD-10-CM

## 2017-02-05 DIAGNOSIS — G8929 Other chronic pain: Secondary | ICD-10-CM | POA: Diagnosis not present

## 2017-02-05 DIAGNOSIS — M25551 Pain in right hip: Secondary | ICD-10-CM | POA: Diagnosis not present

## 2017-02-05 MED ORDER — LIDOCAINE HCL (PF) 1 % IJ SOLN
1.5000 mL | INTRAMUSCULAR | Status: AC | PRN
  Administered 2017-02-05: 1.5 mL

## 2017-02-05 MED ORDER — SODIUM HYALURONATE (VISCOSUP) 20 MG/2ML IX SOSY
20.0000 mg | PREFILLED_SYRINGE | INTRA_ARTICULAR | Status: AC | PRN
  Administered 2017-02-05: 20 mg via INTRA_ARTICULAR

## 2017-02-05 MED ORDER — TRIAMCINOLONE ACETONIDE 40 MG/ML IJ SUSP
40.0000 mg | INTRAMUSCULAR | Status: AC | PRN
  Administered 2017-02-05: 40 mg via INTRA_ARTICULAR

## 2017-02-05 MED ORDER — LIDOCAINE HCL 1 % IJ SOLN
1.5000 mL | INTRAMUSCULAR | Status: AC | PRN
  Administered 2017-02-05: 1.5 mL

## 2017-02-05 NOTE — Progress Notes (Signed)
Office Visit Note  Patient: Jennifer Hall             Date of Birth: [birth year]           MRN: [id number]             PCP: Prince Solian, MD Referring: Prince Solian, MD Visit Date: 02/05/2017 Occupation: @GUAROCC @    Subjective:  Injections (Hyalgan Bilateral #1 patient purchase )   History of Present Illness: Jennifer Hall is a 83 y.o. female  Patient here for Hyalgan injection to bilateral knees. Patient is complaining that she also has right hip pain right low back pain right greater trochanteric bursa pain and would like to be evaluated for that.  Dr. Estanislado Pandy offered her physical therapy back in the February visit but patient apparently did not go to that physical therapy. Today, we encouraged the patient to see Ileana Roup for evaluation and treatment of right greater trochanter bursa pain, right low back pain, right hip pain.  In the meanwhile, we will do steroid injection in the right greater trochanter bursa.  We also gave her Hyalgan injections 1 to both knees.   Activities of Daily Living:  Patient reports morning stiffness for 30 minutes.   Patient Reports nocturnal pain.  Difficulty dressing/grooming: Reports Difficulty climbing stairs: Reports Difficulty getting out of chair: Reports Difficulty using hands for taps, buttons, cutlery, and/or writing: Denies   Review of Systems  Constitutional: Positive for fatigue.  HENT: Negative for mouth sores and mouth dryness.   Eyes: Negative for dryness.  Respiratory: Negative for shortness of breath.   Gastrointestinal: Negative for constipation and diarrhea.  Musculoskeletal: Positive for myalgias and myalgias.  Skin: Negative for sensitivity to sunlight.  Psychiatric/Behavioral: Positive for sleep disturbance. Negative for decreased concentration.    PMFS History:  Patient Active Problem List   Diagnosis Date Noted  . Age-related osteoporosis without current pathological fracture 10/30/2016    . Fibromyalgia 10/22/2016  . Osteopenia of multiple sites 10/22/2016  . Primary osteoarthritis of both knees 10/22/2016  . History of hyperlipidemia 10/22/2016  . History of renal cell cancer 10/22/2016    Past Medical History:  Diagnosis Date  . Diverticulosis   . Fibromyalgia   . HTN (hypertension)   . Hyperlipidemia   . Osteoporosis   . Paresthesias   . Renal cell cancer ([place]) 2010    Family History  Problem Relation Age of Onset  . Hypertension Father   . Lymphoma Mother    Past Surgical History:  Procedure Laterality Date  . EYE SURGERY Right 9/13   cataract  . KIDNEY SURGERY  10/06/08   biopsy, resection  . TONSILLECTOMY     Social History   Social History Narrative   Patient lives at home alone.   Caffeine Use: rarely     Objective: Vital Signs: BP 136/72   Pulse 80   Resp 14   Ht 5\' 6"  (1.676 m)   Wt 170 lb (77.1 kg)   BMI 27.44 kg/m    Physical Exam  Constitutional: She is oriented to person, place, and time. She appears well-developed and well-nourished.  HENT:  Head: Normocephalic and atraumatic.  Eyes: EOM are normal. Pupils are equal, round, and reactive to light.  Cardiovascular: Normal rate, regular rhythm and normal heart sounds.  Exam reveals no gallop and no friction rub.   No murmur heard. Pulmonary/Chest: Effort normal and breath sounds normal. She has no wheezes. She has no rales.  Abdominal: Soft. Bowel  sounds are normal. She exhibits no distension. There is no tenderness. There is no guarding. No hernia.  Musculoskeletal: Normal range of motion. She exhibits no edema, tenderness or deformity.  Lymphadenopathy:    She has no cervical adenopathy.  Neurological: She is alert and oriented to person, place, and time. Coordination normal.  Skin: Skin is warm and dry. Capillary refill takes less than 2 seconds. No rash noted.  Psychiatric: She has a normal mood and affect. Her behavior is normal.  Nursing note and vitals reviewed.     Musculoskeletal Exam:  Full range of motion of all joints Grip strength is equal and strong bilaterally Fiber myalgia tender points are 18 out of 18 positive  CDAI Exam: CDAI Homunculus Exam:   Joint Counts:  CDAI Tender Joint count: 0 CDAI Swollen Joint count: 0  Global Assessments:  Patient Global Assessment: 10 Provider Global Assessment: 10  CDAI Calculated Score: 20  No synovitis on examination  Investigation: No additional findings.   Imaging: No results found.       Speciality Comments: No specialty comments available.    Procedures:  Large Joint Inj Date/Time: 02/05/2017 4:03 PM Performed by: Eliezer Lofts Authorized by: Eliezer Lofts   Consent Given by:  Patient Site marked: the procedure site was marked   Timeout: prior to procedure the correct patient, procedure, and site was verified   Indications:  Pain Location:  Hip Site:  R greater trochanter Prep: patient was prepped and draped in usual sterile fashion   Needle Size:  27 G Approach:  Superior Ultrasound Guidance: No   Fluoroscopic Guidance: No   Arthrogram: No   Medications:  1.5 mL lidocaine (PF) 1 %; 40 mg triamcinolone acetonide 40 MG/ML Aspiration Attempted: Yes   Aspirate amount (mL):  0 Patient tolerance:  Patient tolerated the procedure well with no immediate complications  Right greater trochanteric bursitis. Patient tolerated procedure well. There no complications. Large Joint Inj Date/Time: 02/05/2017 4:05 PM Performed by: Eliezer Lofts Authorized by: Eliezer Lofts   Consent Given by:  Patient Site marked: the procedure site was marked   Timeout: prior to procedure the correct patient, procedure, and site was verified   Indications:  Pain and joint swelling Location:  Knee Site:  L knee Prep: patient was prepped and draped in usual sterile fashion   Needle Size:  27 G Needle Length:  1.5 inches Approach:  Medial Ultrasound Guidance: No   Fluoroscopic  Guidance: No   Arthrogram: No   Medications:  1.5 mL lidocaine 1 %; 20 mg Sodium Hyaluronate 20 MG/2ML Aspiration Attempted: Yes   Aspirate amount (mL):  0 Patient tolerance:  Patient tolerated the procedure well with no immediate complications  Hyalgan No. 1 bilateral knees Large Joint Inj Date/Time: 02/05/2017 4:07 PM Performed by: Eliezer Lofts Authorized by: Eliezer Lofts   Consent Given by:  Patient Site marked: the procedure site was marked   Timeout: prior to procedure the correct patient, procedure, and site was verified   Indications:  Pain and joint swelling Location:  Knee Site:  R knee Prep: patient was prepped and draped in usual sterile fashion   Needle Size:  27 G Needle Length:  1.5 inches Approach:  Medial Ultrasound Guidance: No   Fluoroscopic Guidance: No   Arthrogram: No   Medications:  1.5 mL lidocaine 1 %; 20 mg Sodium Hyaluronate 20 MG/2ML Aspiration Attempted: Yes   Patient tolerance:  Patient tolerated the procedure well with no immediate complications  Bilateral knee with  Hyalgan injection #1 Patient purchase. There no complications.   Allergies: Latex   Assessment / Plan:     Visit Diagnoses: Trochanteric bursitis, right hip  Pain of right hip joint  Primary osteoarthritis of both knees    Plan: #1: Ongoing right hip pain/low back pain/right greater trochanteric bursitis. Refer to Ileana Roup  Evaluate and treat Patient received a cortisone injection to the right greater trochanteric bursa today.  #2: Recent MRI showed spinal stenosis. Dr. Durward Fortes advise her that she may benefit from surgery. Patient also saw saw a neurosurgeon; he also advised her surgery that would be outpatient and he would make the small incision. Patient is not agreeable at this time to get surgery done. She wants to try other modalities of treatment before she committed to surgery  #3: OA of bilateral knees and bilateral knee pain. Hyalgan No. 1 given to  both knees today. I am hopeful that the Hyalgan injections will help not only her knees but may assist her in not exacerbating her low back and her right hip.  Patient was also referred to physical therapy on February visit. She actually went in December and after 6 visit she did not get significant improvement and is very uncomfortable in her right hip and her low back.  She also has seen Dr. Ernestina Patches for right hip injection.  Fibromyalgia syndrome. Active disease with dry eyes pain and positive tender points. 18 out of 18. History of same.  Patient was given cortisone injection of the right greater trochanter bursa. She tolerated procedure well. There no complications. Please see procedure notes for full details.  Orders: Orders Placed This Encounter  Procedures  . Large Joint Injection/Arthrocentesis   No orders of the defined types were placed in this encounter.   Face-to-face time spent with patient was 30 minutes. 50% of time was spent in counseling and coordination of care.  Follow-Up Instructions: No Follow-up on file.   Eliezer Lofts, PA-C I examined and evaluated the patient with Eliezer Lofts PA. The plan of care was discussed as noted above.  Bo Merino, MD Note - This record has been created using Editor, commissioning.  Chart creation errors have been sought, but may not always  have been located. Such creation errors do not reflect on  the standard of medical care.

## 2017-02-05 NOTE — Telephone Encounter (Signed)
Patient called and wants you to check to see when she had her last injection for the bursitis in her hip.  She wants to know if she can have another one today when she comes in for her knee injection today.  CB#[phone]

## 2017-02-05 NOTE — Telephone Encounter (Signed)
Patient seen in office today and this issue was addressed.

## 2017-02-12 ENCOUNTER — Ambulatory Visit (INDEPENDENT_AMBULATORY_CARE_PROVIDER_SITE_OTHER): Payer: Medicare Other | Admitting: Rheumatology

## 2017-02-12 DIAGNOSIS — M25561 Pain in right knee: Secondary | ICD-10-CM | POA: Diagnosis not present

## 2017-02-12 DIAGNOSIS — M1712 Unilateral primary osteoarthritis, left knee: Secondary | ICD-10-CM | POA: Diagnosis not present

## 2017-02-12 DIAGNOSIS — M1711 Unilateral primary osteoarthritis, right knee: Secondary | ICD-10-CM | POA: Diagnosis not present

## 2017-02-12 DIAGNOSIS — G8929 Other chronic pain: Secondary | ICD-10-CM

## 2017-02-12 DIAGNOSIS — M25562 Pain in left knee: Secondary | ICD-10-CM | POA: Diagnosis not present

## 2017-02-12 DIAGNOSIS — M170 Bilateral primary osteoarthritis of knee: Secondary | ICD-10-CM

## 2017-02-12 MED ORDER — SODIUM HYALURONATE (VISCOSUP) 20 MG/2ML IX SOSY
20.0000 mg | PREFILLED_SYRINGE | INTRA_ARTICULAR | Status: AC | PRN
  Administered 2017-02-12: 20 mg via INTRA_ARTICULAR

## 2017-02-12 MED ORDER — LIDOCAINE HCL (PF) 1 % IJ SOLN
2.0000 mL | INTRAMUSCULAR | Status: AC | PRN
  Administered 2017-02-12: 2 mL

## 2017-02-12 NOTE — Progress Notes (Signed)
   Procedure Note  Patient: Jennifer Hall             Date of Birth: [birth year]           MRN: [id number]             Visit Date: 02/12/2017  Procedures: Visit Diagnoses: Primary osteoarthritis of both knees  Bilateral chronic knee pain  Large Joint Inj Date/Time: 02/12/2017 9:37 AM Performed by: Eliezer Lofts Authorized by: Eliezer Lofts   Consent Given by:  Patient Site marked: the procedure site was marked   Timeout: prior to procedure the correct patient, procedure, and site was verified   Indications:  Pain and joint swelling Location:  Knee Site:  R knee Prep: patient was prepped and draped in usual sterile fashion   Needle Size:  27 G Needle Length:  1.5 inches Approach:  Medial Ultrasound Guidance: No   Fluoroscopic Guidance: No   Arthrogram: No   Medications:  2 mL lidocaine (PF) 1 %; 20 mg Sodium Hyaluronate 20 MG/2ML Aspiration Attempted: Yes   Aspirate amount (mL):  0 Patient tolerance:  Patient tolerated the procedure well with no immediate complications  Hyalgan #2 bilateral knees  Patient has purchased this item Large Joint Inj Date/Time: 02/12/2017 9:38 AM Performed by: Eliezer Lofts Authorized by: Eliezer Lofts   Consent Given by:  Patient Site marked: the procedure site was marked   Timeout: prior to procedure the correct patient, procedure, and site was verified   Indications:  Pain and joint swelling Location:  Knee Site:  L knee Prep: patient was prepped and draped in usual sterile fashion   Needle Size:  27 G Needle Length:  1.5 inches Approach:  Medial Ultrasound Guidance: No   Fluoroscopic Guidance: No   Arthrogram: No   Medications:  2 mL lidocaine (PF) 1 %; 20 mg Sodium Hyaluronate 20 MG/2ML Aspiration Attempted: Yes   Aspirate amount (mL):  0 Patient tolerance:  Patient tolerated the procedure well with no immediate complications  Hyalgan #2 bilateral knees  Patient has purchased this item

## 2017-02-19 ENCOUNTER — Ambulatory Visit (INDEPENDENT_AMBULATORY_CARE_PROVIDER_SITE_OTHER): Payer: Medicare Other | Admitting: Rheumatology

## 2017-02-19 ENCOUNTER — Ambulatory Visit: Payer: Medicare Other | Admitting: Rheumatology

## 2017-02-19 DIAGNOSIS — M1711 Unilateral primary osteoarthritis, right knee: Secondary | ICD-10-CM

## 2017-02-19 DIAGNOSIS — M170 Bilateral primary osteoarthritis of knee: Secondary | ICD-10-CM | POA: Diagnosis not present

## 2017-02-19 DIAGNOSIS — M1712 Unilateral primary osteoarthritis, left knee: Secondary | ICD-10-CM

## 2017-02-19 MED ORDER — SODIUM HYALURONATE (VISCOSUP) 20 MG/2ML IX SOSY
20.0000 mg | PREFILLED_SYRINGE | INTRA_ARTICULAR | Status: AC | PRN
  Administered 2017-02-19: 20 mg via INTRA_ARTICULAR

## 2017-02-19 MED ORDER — LIDOCAINE HCL 1 % IJ SOLN
1.5000 mL | INTRAMUSCULAR | Status: AC | PRN
  Administered 2017-02-19: 1.5 mL

## 2017-02-19 NOTE — Progress Notes (Signed)
   Procedure Note  Patient: Jennifer Hall             Date of Birth: Nov 28, 1926           MRN: [id number]             Visit Date: 02/19/2017  Procedures: Visit Diagnoses: No diagnosis found.  Large Joint Inj Date/Time: 02/19/2017 10:52 AM Performed by: Bo Merino Authorized by: Bo Merino   Consent Given by:  Patient Site marked: the procedure site was marked   Timeout: prior to procedure the correct patient, procedure, and site was verified   Indications:  Pain and joint swelling Location:  Knee Site:  R knee Prep: patient was prepped and draped in usual sterile fashion   Needle Size:  27 G Needle Length:  1.5 inches Approach:  Medial Ultrasound Guidance: No   Fluoroscopic Guidance: No   Arthrogram: No   Medications:  1.5 mL lidocaine 1 %; 20 mg Sodium Hyaluronate 20 MG/2ML Aspiration Attempted: Yes   Patient tolerance:  Patient tolerated the procedure well with no immediate complications Large Joint Inj Date/Time: 02/19/2017 10:53 AM Performed by: Bo Merino Authorized by: Bo Merino   Consent Given by:  Patient Site marked: the procedure site was marked   Timeout: prior to procedure the correct patient, procedure, and site was verified   Indications:  Pain and joint swelling Location:  Knee Site:  L knee Prep: patient was prepped and draped in usual sterile fashion   Needle Size:  27 G Needle Length:  1.5 inches Approach:  Medial Ultrasound Guidance: No   Fluoroscopic Guidance: No   Arthrogram: No   Medications:  1.5 mL lidocaine 1 %; 20 mg Sodium Hyaluronate 20 MG/2ML Aspiration Attempted: Yes   Patient tolerance:  Patient tolerated the procedure well with no immediate complications   Bo Merino, MD

## 2017-02-24 DIAGNOSIS — M1711 Unilateral primary osteoarthritis, right knee: Secondary | ICD-10-CM

## 2017-02-24 DIAGNOSIS — M170 Bilateral primary osteoarthritis of knee: Secondary | ICD-10-CM

## 2017-02-24 DIAGNOSIS — M1712 Unilateral primary osteoarthritis, left knee: Secondary | ICD-10-CM | POA: Diagnosis not present

## 2017-02-24 NOTE — Progress Notes (Signed)
   Procedure Note  Patient: Jennifer Hall             Date of Birth: [birth year]           MRN: [id number]             Visit Date: 02/26/2017  Procedures: Visit Diagnoses: Primary osteoarthritis of both knees - Plan: Large Joint Injection/Arthrocentesis, Large Joint Injection/Arthrocentesis  Patient is here for her bilateral knee joint  Hyalgan injections # . Large Joint Inj Date/Time: 02/24/2017 3:52 PM Performed by: Bo Merino Authorized by: Bo Merino   Consent Given by:  Patient Site marked: the procedure site was marked   Timeout: prior to procedure the correct patient, procedure, and site was verified   Indications:  Pain and joint swelling Location:  Knee Site:  R knee Prep: patient was prepped and draped in usual sterile fashion   Needle Size:  27 G Needle Length:  1.5 inches Approach:  Medial Ultrasound Guidance: No   Fluoroscopic Guidance: No   Arthrogram: No   Medications:  2 mL lidocaine 2 %; 20 mg Sodium Hyaluronate 20 MG/2ML Aspiration Attempted: Yes   Patient tolerance:  Patient tolerated the procedure well with no immediate complications Large Joint Inj Date/Time: 02/24/2017 3:54 PM Performed by: Bo Merino Authorized by: Bo Merino   Consent Given by:  Patient Site marked: the procedure site was marked   Timeout: prior to procedure the correct patient, procedure, and site was verified   Indications:  Pain and joint swelling Location:  Knee Site:  L knee Prep: patient was prepped and draped in usual sterile fashion   Needle Size:  27 G Needle Length:  1.5 inches Approach:  Medial Ultrasound Guidance: No   Fluoroscopic Guidance: No   Arthrogram: No   Medications:  2 mL lidocaine 2 %; 20 mg Sodium Hyaluronate 20 MG/2ML Aspiration Attempted: Yes   Patient tolerance:  Patient tolerated the procedure well with no immediate complications    Bo Merino, MD

## 2017-02-26 ENCOUNTER — Ambulatory Visit (INDEPENDENT_AMBULATORY_CARE_PROVIDER_SITE_OTHER): Payer: Medicare Other | Admitting: Rheumatology

## 2017-02-26 DIAGNOSIS — M170 Bilateral primary osteoarthritis of knee: Secondary | ICD-10-CM

## 2017-02-26 MED ORDER — LIDOCAINE HCL 2 % IJ SOLN
2.0000 mL | INTRAMUSCULAR | Status: AC | PRN
  Administered 2017-02-24: 2 mL

## 2017-02-26 MED ORDER — SODIUM HYALURONATE (VISCOSUP) 20 MG/2ML IX SOSY
20.0000 mg | PREFILLED_SYRINGE | INTRA_ARTICULAR | Status: AC | PRN
  Administered 2017-02-24: 20 mg via INTRA_ARTICULAR

## 2017-03-06 ENCOUNTER — Ambulatory Visit (INDEPENDENT_AMBULATORY_CARE_PROVIDER_SITE_OTHER): Payer: Medicare Other | Admitting: Rheumatology

## 2017-03-06 DIAGNOSIS — M1711 Unilateral primary osteoarthritis, right knee: Secondary | ICD-10-CM | POA: Diagnosis not present

## 2017-03-06 DIAGNOSIS — M1712 Unilateral primary osteoarthritis, left knee: Secondary | ICD-10-CM | POA: Diagnosis not present

## 2017-03-06 DIAGNOSIS — M170 Bilateral primary osteoarthritis of knee: Secondary | ICD-10-CM | POA: Diagnosis not present

## 2017-03-06 MED ORDER — LIDOCAINE HCL 2 % IJ SOLN
1.5000 mL | INTRAMUSCULAR | Status: AC | PRN
  Administered 2017-03-06: 1.5 mL

## 2017-03-06 MED ORDER — SODIUM HYALURONATE (VISCOSUP) 20 MG/2ML IX SOSY
20.0000 mg | PREFILLED_SYRINGE | INTRA_ARTICULAR | Status: AC | PRN
  Administered 2017-03-06: 20 mg via INTRA_ARTICULAR

## 2017-03-06 NOTE — Progress Notes (Signed)
   Procedure Note  Patient: Jennifer Hall             Date of Birth: Nov 25, 1926           MRN: [id number]             Visit Date: 03/06/2017  Procedures: Visit Diagnoses: Primary osteoarthritis of both knees - Plan: Large Joint Injection/Arthrocentesis, Large Joint Injection/Arthrocentesis Hyalgan #5 bilateral knees  Large Joint Inj Date/Time: 03/06/2017 1:14 PM Performed by: Bo Merino Authorized by: Bo Merino   Consent Given by:  Patient Site marked: the procedure site was marked   Timeout: prior to procedure the correct patient, procedure, and site was verified   Indications:  Pain Location:  Knee Prep: patient was prepped and draped in usual sterile fashion   Needle Size:  27 G Needle Length:  1.5 inches Ultrasound Guidance: No   Fluoroscopic Guidance: No   Arthrogram: No   Medications:  1.5 mL lidocaine 2 %; 20 mg Sodium Hyaluronate 20 MG/2ML Aspiration Attempted: Yes   Patient tolerance:  Patient tolerated the procedure well with no immediate complications Large Joint Inj Date/Time: 03/06/2017 1:15 PM Performed by: Bo Merino Authorized by: Bo Merino   Consent Given by:  Patient Site marked: the procedure site was marked   Timeout: prior to procedure the correct patient, procedure, and site was verified   Indications:  Pain Location:  Knee Prep: patient was prepped and draped in usual sterile fashion   Needle Size:  27 G Needle Length:  1.5 inches Ultrasound Guidance: No   Fluoroscopic Guidance: No   Arthrogram: No   Medications:  1.5 mL lidocaine 2 %; 20 mg Sodium Hyaluronate 20 MG/2ML Aspiration Attempted: Yes   Patient tolerance:  Patient tolerated the procedure well with no immediate complications    Bo Merino, MD

## 2017-03-12 ENCOUNTER — Ambulatory Visit: Payer: Medicare Other | Admitting: Rheumatology

## 2017-03-19 ENCOUNTER — Ambulatory Visit: Payer: Medicare Other | Admitting: Rheumatology

## 2017-03-26 ENCOUNTER — Ambulatory Visit: Payer: Medicare Other | Admitting: Rheumatology

## 2017-04-02 ENCOUNTER — Ambulatory Visit: Payer: Medicare Other | Admitting: Rheumatology

## 2017-04-09 ENCOUNTER — Ambulatory Visit: Payer: Medicare Other | Admitting: Rheumatology

## 2017-04-28 NOTE — Progress Notes (Deleted)
   Office Visit Note  Patient: Jennifer Hall             Date of Birth: 07-Mar-1927           MRN: [id number]             PCP: Prince Solian, MD Visit Date: 04/30/2017   Assessment: Visit Diagnoses: Fibromyalgia  Other fatigue  Primary osteoarthritis of both knees  Age-related osteoporosis without current pathological fracture - 11/17/2012:  Bone density showed T-score of -1.6 in the lumbar region and T-score of -2.6 in the femoral neck area consistent with osteoporosis.  History of hyperlipidemia  History of renal cell cancer   Follow-Up Instructions: No Follow-up on file.  Orders: No orders of the defined types were placed in this encounter.  No orders of the defined types were placed in this encounter.    Subjective:    Allergies: Latex   Activities of Daily Living: ***   History of Present Illness: [name] is a 83 y.o. female ***   No Rheumatology ROS completed.    Investigation: No additional findings.   Objective: Vital Signs: BP (!) 152/82   Pulse 86   Resp 14   Ht 5' 6.5" (1.689 m)   Wt 162 lb (73.5 kg)   BMI 25.76 kg/m    Physical Exam   Musculoskeletal Exam: ***  CDAI Exam: No CDAI exam completed.  CDAI comments:  Speciality Comments: No specialty comments available.  Imaging: No results found.   PMFS History:  Patient Active Problem List   Diagnosis Date Noted  . Age-related osteoporosis without current pathological fracture 10/30/2016  . Fibromyalgia 10/22/2016  . Osteopenia of multiple sites 10/22/2016  . Primary osteoarthritis of both knees 10/22/2016  . History of hyperlipidemia 10/22/2016  . History of renal cell cancer 10/22/2016    Past Medical History:  Diagnosis Date  . Diverticulosis   . Fibromyalgia   . HTN (hypertension)   . Hyperlipidemia   . Osteoporosis   . Paresthesias   . Renal cell cancer ([place]) 2010    Family History  Problem Relation Age of Onset  . Hypertension Father   . Lymphoma  Mother    Past Surgical History:  Procedure Laterality Date  . EYE SURGERY Right 9/13   cataract  . KIDNEY SURGERY  10/06/08   biopsy, resection  . TONSILLECTOMY     Social History   Social History Narrative   Patient lives at home alone.   Caffeine Use: rarely     Procedures:  No procedures performed  Bo Merino, MD  Note - This record has been created using Editor, commissioning. Chart creation errors have been sought, but may not always have been located. Such creation errors do not reflect on the standard of medical care.

## 2017-04-30 ENCOUNTER — Encounter: Payer: Self-pay | Admitting: Rheumatology

## 2017-04-30 ENCOUNTER — Ambulatory Visit (INDEPENDENT_AMBULATORY_CARE_PROVIDER_SITE_OTHER): Payer: Medicare Other | Admitting: Rheumatology

## 2017-04-30 VITALS — BP 152/82 | HR 86 | Resp 14 | Ht 66.5 in | Wt 162.0 lb

## 2017-04-30 DIAGNOSIS — Z8639 Personal history of other endocrine, nutritional and metabolic disease: Secondary | ICD-10-CM | POA: Diagnosis not present

## 2017-04-30 DIAGNOSIS — M797 Fibromyalgia: Secondary | ICD-10-CM

## 2017-04-30 DIAGNOSIS — Z85528 Personal history of other malignant neoplasm of kidney: Secondary | ICD-10-CM | POA: Diagnosis not present

## 2017-04-30 DIAGNOSIS — M170 Bilateral primary osteoarthritis of knee: Secondary | ICD-10-CM | POA: Diagnosis not present

## 2017-04-30 DIAGNOSIS — R5383 Other fatigue: Secondary | ICD-10-CM

## 2017-04-30 DIAGNOSIS — M542 Cervicalgia: Secondary | ICD-10-CM

## 2017-04-30 DIAGNOSIS — M810 Age-related osteoporosis without current pathological fracture: Secondary | ICD-10-CM | POA: Diagnosis not present

## 2017-04-30 NOTE — Progress Notes (Signed)
Office Visit Note  Patient: Jennifer Hall             Date of Birth: [birth year]           MRN: [id number]             PCP: Prince Solian, MD Referring: Prince Solian, MD Visit Date: 04/30/2017 Occupation: @GUAROCC @    Subjective:  Pain of the Neck (Also into left arm)   History of Present Illness: Terrina Docter is a 83 y.o. female with history of fibromyalgia and osteoarthritis. She states her left trochanteric bursitis is better after physical therapy. She states 2 weeks ago she strained her neck and she's been having pain in her neck which is radiating into her left arm. She had some physical therapy on her neck by the physical therapist but did not have much relief.  her knee joints are doing better after the injection.  Activities of Daily Living:  Patient reports morning stiffness for 0 minute.   Patient Reports nocturnal pain.  Difficulty dressing/grooming: Denies Difficulty climbing stairs: Denies Difficulty getting out of chair: Denies Difficulty using hands for taps, buttons, cutlery, and/or writing: Denies   Review of Systems  Constitutional: Positive for fatigue. Negative for night sweats, weight gain, weight loss and weakness.  HENT: Negative for mouth sores, trouble swallowing, trouble swallowing, mouth dryness and nose dryness.   Eyes: Negative for pain, redness, visual disturbance and dryness.  Respiratory: Negative for cough, shortness of breath and difficulty breathing.   Cardiovascular: Positive for hypertension. Negative for chest pain, palpitations, irregular heartbeat and swelling in legs/feet.  Gastrointestinal: Negative for blood in stool, constipation and diarrhea.  Endocrine: Negative for increased urination.  Genitourinary: Negative for vaginal dryness.  Musculoskeletal: Positive for arthralgias and joint pain. Negative for joint swelling, myalgias, muscle weakness, morning stiffness, muscle tenderness and myalgias.  Skin: Negative for  color change, rash, hair loss, skin tightness, ulcers and sensitivity to sunlight.  Allergic/Immunologic: Negative for susceptible to infections.  Neurological: Negative for dizziness, memory loss and night sweats.  Hematological: Negative for swollen glands.  Psychiatric/Behavioral: Negative for depressed mood and sleep disturbance. The patient is not nervous/anxious.     PMFS History:  Patient Active Problem List   Diagnosis Date Noted  . Age-related osteoporosis without current pathological fracture 10/30/2016  . Fibromyalgia 10/22/2016  . Osteopenia of multiple sites 10/22/2016  . Primary osteoarthritis of both knees 10/22/2016  . History of hyperlipidemia 10/22/2016  . History of renal cell cancer 10/22/2016    Past Medical History:  Diagnosis Date  . Diverticulosis   . Fibromyalgia   . HTN (hypertension)   . Hyperlipidemia   . Osteoporosis   . Paresthesias   . Renal cell cancer ([place]) 2010    Family History  Problem Relation Age of Onset  . Hypertension Father   . Lymphoma Mother    Past Surgical History:  Procedure Laterality Date  . EYE SURGERY Right 9/13   cataract  . KIDNEY SURGERY  10/06/08   biopsy, resection  . TONSILLECTOMY     Social History   Social History Narrative   Patient lives at home alone.   Caffeine Use: rarely     Objective: Vital Signs: BP (!) 152/82   Pulse 86   Resp 14   Ht 5' 6.5" (1.689 m)   Wt 162 lb (73.5 kg)   BMI 25.76 kg/m    Physical Exam  Constitutional: She is oriented to person, place, and time. She appears  well-developed and well-nourished.  HENT:  Head: Normocephalic and atraumatic.  Eyes: Conjunctivae and EOM are normal.  Neck: Normal range of motion.  Cardiovascular: Normal rate, regular rhythm, normal heart sounds and intact distal pulses.   Pulmonary/Chest: Effort normal and breath sounds normal.  Abdominal: Soft. Bowel sounds are normal.  Lymphadenopathy:    She has no cervical adenopathy.  Neurological:  She is alert and oriented to person, place, and time.  Skin: Skin is warm and dry. Capillary refill takes less than 2 seconds.  Psychiatric: She has a normal mood and affect. Her behavior is normal.  Nursing note and vitals reviewed.    Musculoskeletal Exam: C-spine some discomfort with left lateral rotation. She does spasm in the trapezius and supraspinatus muscle. Left shoulder joint was good range of motion. Although joints wrist joint MCPs PIPs DIPs with good range of motion. Hip joints are good range of motion. She has some crepitus with range of motion of bilateral knee joints.   CDAI Exam: No CDAI exam completed.    Investigation: No additional findings.   Imaging: No results found.  Speciality Comments: No specialty comments available.    Procedures:  No procedures performed Allergies: Latex   Assessment / Plan:     Visit Diagnoses: Fibromyalgia: She has generalized pain and positive tender points and discomfort.  Other fatigue: She reports her fatigue is better.  Primary osteoarthritis of both knees: She's chronic pain in her knee joints but doing better after the knee joint injections.  Neck pain: She had recent neck strain. She has discomfort in her left trapezius and left supraspinatus muscle. We discussed possible use of cortisone injection. But she would like to wait. Her blood pressure was also elevated so we decided not to do injection today. I've advised her to use Aspercreme and also do some exercises. A handout on exercises were given.  Age-related osteoporosis without current pathological fracture - 11/17/2012:  Bone density showed T-score of -1.6 in the lumbar region and T-score of -2.6 in the femoral neck area consistent with osteoporosis. Patient does not want to use any bisphosphonates or any other treatment.  History of hyperlipidemia  History of renal cell cancer    Orders: No orders of the defined types were placed in this encounter.  No orders  of the defined types were placed in this encounter.    Follow-Up Instructions: Return if symptoms worsen or fail to improve, for Osteoarthritis, Osteoporosis,FMS.   Bo Merino, MD  Note - This record has been created using Editor, commissioning.  Chart creation errors have been sought, but may not always  have been located. Such creation errors do not reflect on  the standard of medical care.

## 2017-04-30 NOTE — Patient Instructions (Signed)
Cervical Strain and Sprain Rehab Ask your health care provider which exercises are safe for you. Do exercises exactly as told by your health care provider and adjust them as directed. It is normal to feel mild stretching, pulling, tightness, or discomfort as you do these exercises, but you should stop right away if you feel sudden pain or your pain gets worse.Do not begin these exercises until told by your health care provider. Stretching and range of motion exercises These exercises warm up your muscles and joints and improve the movement and flexibility of your neck. These exercises also help to relieve pain, numbness, and tingling. Exercise A: Cervical side bend  1. Using good posture, sit on a stable chair or stand up. 2. Without moving your shoulders, slowly tilt your left / right ear to your shoulder until you feel a stretch in your neck muscles. You should be looking straight ahead. 3. Hold for __________ seconds. 4. Repeat with the other side of your neck. Repeat __________ times. Complete this exercise __________ times a day. Exercise B: Cervical rotation  1. Using good posture, sit on a stable chair or stand up. 2. Slowly turn your head to the side as if you are looking over your left / right shoulder. ? Keep your eyes level with the ground. ? Stop when you feel a stretch along the side and the back of your neck. 3. Hold for __________ seconds. 4. Repeat this by turning to your other side. Repeat __________ times. Complete this exercise __________ times a day. Exercise C: Thoracic extension and pectoral stretch 1. Roll a towel or a small blanket so it is about 4 inches (10 cm) in diameter. 2. Lie down on your back on a firm surface. 3. Put the towel lengthwise, under your spine in the middle of your back. It should not be not under your shoulder blades. The towel should line up with your spine from your middle back to your lower back. 4. Put your hands behind your head and let your  elbows fall out to your sides. 5. Hold for __________ seconds. Repeat __________ times. Complete this exercise __________ times a day. Strengthening exercises These exercises build strength and endurance in your neck. Endurance is the ability to use your muscles for a long time, even after your muscles get tired. Exercise D: Upper cervical flexion, isometric 1. Lie on your back with a thin pillow behind your head and a small rolled-up towel under your neck. 2. Gently tuck your chin toward your chest and nod your head down to look toward your feet. Do not lift your head off the pillow. 3. Hold for __________ seconds. 4. Release the tension slowly. Relax your neck muscles completely before you repeat this exercise. Repeat __________ times. Complete this exercise __________ times a day. Exercise E: Cervical extension, isometric  1. Stand about 6 inches (15 cm) away from a wall, with your back facing the wall. 2. Place a soft object, about 6-8 inches (15-20 cm) in diameter, between the back of your head and the wall. A soft object could be a small pillow, a ball, or a folded towel. 3. Gently tilt your head back and press into the soft object. Keep your jaw and forehead relaxed. 4. Hold for __________ seconds. 5. Release the tension slowly. Relax your neck muscles completely before you repeat this exercise. Repeat __________ times. Complete this exercise __________ times a day. Posture and body mechanics  Body mechanics refers to the movements and positions of   your body while you do your daily activities. Posture is part of body mechanics. Good posture and healthy body mechanics can help to relieve stress in your body's tissues and joints. Good posture means that your spine is in its natural S-curve position (your spine is neutral), your shoulders are pulled back slightly, and your head is not tipped forward. The following are general guidelines for applying improved posture and body mechanics to  your everyday activities. Standing  When standing, keep your spine neutral and keep your feet about hip-width apart. Keep a slight bend in your knees. Your ears, shoulders, and hips should line up.  When you do a task in which you stand in one place for a long time, place one foot up on a stable object that is 2-4 inches (5-10 cm) high, such as a footstool. This helps keep your spine neutral. Sitting   When sitting, keep your spine neutral and your keep feet flat on the floor. Use a footrest, if necessary, and keep your thighs parallel to the floor. Avoid rounding your shoulders, and avoid tilting your head forward.  When working at a desk or a computer, keep your desk at a height where your hands are slightly lower than your elbows. Slide your chair under your desk so you are close enough to maintain good posture.  When working at a computer, place your monitor at a height where you are looking straight ahead and you do not have to tilt your head forward or downward to look at the screen. Resting When lying down and resting, avoid positions that are most painful for you. Try to support your neck in a neutral position. You can use a contour pillow or a small rolled-up towel. Your pillow should support your neck but not push on it. This information is not intended to replace advice given to you by your health care provider. Make sure you discuss any questions you have with your health care provider. Document Released: 08/19/2005 Document Revised: 04/25/2016 Document Reviewed: 07/26/2015 Elsevier Interactive Patient Education  2018 [employer]  

## 2017-05-01 ENCOUNTER — Telehealth: Payer: Self-pay | Admitting: Rheumatology

## 2017-05-01 NOTE — Telephone Encounter (Signed)
Attempted to contact the patient and left message for patient to call the office.  

## 2017-05-01 NOTE — Telephone Encounter (Signed)
Patient got disconnected, returning your call.

## 2017-05-01 NOTE — Telephone Encounter (Signed)
Patient returning your call. Patient request a call before we close if possible.

## 2017-05-01 NOTE — Telephone Encounter (Addendum)
Patient states she is having pain in her left shoulder. Patient states the pain started in her neck on the left side and has now travelled down her neck to her shoulder and down her arm to her elbow. Call was disconnected, try to call back and call was unable to be connected due to network difficulties.

## 2017-05-01 NOTE — Telephone Encounter (Signed)
Patient would like a call back concerning pain she is experiencing in Cspine, and lt shoulder. Patient is not sleeping due to pain. Patient would like to know if you can call her something in for pain to help her sleep.  Patient requesting a muscle relaxer. Advil not helping. Patient uses [facility] on [address]

## 2017-05-02 NOTE — Telephone Encounter (Signed)
Patient called back and states she has been using Asper Cream and it has not been helping with the pain. Patient is requesting a prescription to help with the pain.

## 2017-05-02 NOTE — Telephone Encounter (Signed)
Patient advised of recommendations. Patient states she will finish PT first and if she still feels the needs she will call back for referrals. Patient asked about prescription for pain medication. Patient advised we do not give prescription for pain medication. Patient advised that we suggest tylenol. Patient verbalized understanding.

## 2017-05-02 NOTE — Telephone Encounter (Signed)
Refer patient to pain management. If she wants we can refer her to Dr. Ernestina Patches as well.

## 2017-05-02 NOTE — Telephone Encounter (Signed)
See previous phone note.  

## 2017-06-20 ENCOUNTER — Telehealth: Payer: Self-pay

## 2017-06-20 DIAGNOSIS — M25519 Pain in unspecified shoulder: Secondary | ICD-10-CM

## 2017-06-20 DIAGNOSIS — M542 Cervicalgia: Secondary | ICD-10-CM

## 2017-06-20 NOTE — Telephone Encounter (Signed)
Patient would like to have a referral to therapy for her neck and left shoulder sent to [facility].  Cb# is [phone].  Please advise.  Thank You.

## 2017-06-23 NOTE — Telephone Encounter (Signed)
Left message to notify patient that referral is placed.

## 2017-06-23 NOTE — Addendum Note (Signed)
Addended by: Carole Binning on: 06/23/2017 03:42 PM   Modules accepted: Orders

## 2017-06-23 NOTE — Telephone Encounter (Signed)
Referral placed.

## 2017-07-08 ENCOUNTER — Telehealth: Payer: Self-pay

## 2017-07-08 NOTE — Telephone Encounter (Signed)
Patient called concerning Referral for Jennifer Hall for PT.

## 2017-07-09 ENCOUNTER — Telehealth: Payer: Self-pay

## 2017-07-09 NOTE — Telephone Encounter (Signed)
Marlowe Kays with [facility] called stating that referral needs to be faxed to [phone] patient has appointment at 2pm today.  Thank You.

## 2017-07-09 NOTE — Telephone Encounter (Signed)
Patient called stating that she has an appointment today with Ileana Roup at 2pm, but was told that they had not received a referral from our office.  Referral is in patient chart.  Patient stated that referral needs to be re-faxed to [phone].  Please advise.  Thank You.

## 2017-07-09 NOTE — Telephone Encounter (Signed)
Refaxed referral again, referral received, I called [facility]

## 2017-09-15 ENCOUNTER — Telehealth: Payer: Self-pay | Admitting: Rheumatology

## 2017-09-15 NOTE — Telephone Encounter (Signed)
Patient left a voicemail asking if she needed to continue Physical Therapy?  Her CB# [phone]

## 2017-09-15 NOTE — Telephone Encounter (Signed)
Patient states she has been attending PT at [facility]. Patient states she is doing very well and does not feel like she needs another appointment. Patient states she would like [place] to notify [facility] that she does not need another appointment.

## 2017-09-15 NOTE — Telephone Encounter (Signed)
Okay to notify

## 2017-09-16 NOTE — Telephone Encounter (Signed)
Notified [facility] that she will not be attending PT any longer.

## 2017-10-29 ENCOUNTER — Ambulatory Visit (INDEPENDENT_AMBULATORY_CARE_PROVIDER_SITE_OTHER): Payer: Medicare Other | Admitting: Physical Medicine and Rehabilitation

## 2017-10-29 ENCOUNTER — Encounter (INDEPENDENT_AMBULATORY_CARE_PROVIDER_SITE_OTHER): Payer: Self-pay | Admitting: Physical Medicine and Rehabilitation

## 2017-10-29 VITALS — BP 139/81 | HR 90 | Temp 98.6°F

## 2017-10-29 DIAGNOSIS — M5416 Radiculopathy, lumbar region: Secondary | ICD-10-CM | POA: Diagnosis not present

## 2017-10-29 DIAGNOSIS — M48062 Spinal stenosis, lumbar region with neurogenic claudication: Secondary | ICD-10-CM | POA: Diagnosis not present

## 2017-10-29 MED ORDER — HYDROCODONE-ACETAMINOPHEN 5-325 MG PO TABS: 1.0000 | ORAL_TABLET | Freq: Three times a day (TID) | ORAL | 0 refills | Status: DC | PRN

## 2017-10-29 NOTE — Progress Notes (Signed)
[address] [place] - 84 y.o. female MRN [id number]  Date of birth: 1927-07-28  Office Visit Note: Visit Date: 10/29/2017 PCP: Prince Solian, MD Referred by: Prince Solian, MD  Subjective: Chief Complaint  Patient presents with  . Lower Back - Pain  . Right Leg - Pain  . Right Hip - Pain  . Right Ankle - Pain   HPI: Mrs. Jennifer Hall is a very pleasant 84 year old female who is accompanied by her daughter who helps provide some of the history.  Actually no missed bowel movement fairly well we have seen each other over the years for a very similar complaint which is right hip and leg pain down past the knee into the anterior lateral aspect of the right calf and into the top of the foot and the big toe in a pretty classic L5 distribution.  She does get pain and paresthesia here.  She had MRI findings in 2016 of small left paracentral disc extrusion at L3-4 but also multifactorial lateral recess stenosis and some central stenosis at L4-5 with facet arthropathy.  I think that is the level of is giving her most of her problems.  By way of history we have completed multiple epidural injections with only short-term relief.  The last injection was performed in 2017 which was a right L5 transforaminal epidural steroid injection.  The patient also suffers from fibromyalgia and continues to follow with Dr. Estanislado Pandy quite frequently.  She comes in today with continued right hip and leg pain in the neck consistent distribution.  She reports worsening over the last year but really steady symptoms overall.  She has never really been intolerant to any medications.  Every medication was tried including duloxetine as well as norepinephrine type tricyclic medicines.  Pain medications as well as anti-inflammatories.  She really has not been tolerant to most medications or will be unwilling to try them when she rates the label.  Regardless she is having a lot of pain she has had no focal weakness or trauma or falls.   She has no left-sided complaints.  Symptoms worse with standing and ambulating.  She has better at rest.  She also has relief of symptoms with pushing on the grocery cart when she is walking at the store.    Review of Systems  Constitutional: Negative for chills, fever, malaise/fatigue and weight loss.  HENT: Negative for hearing loss and sinus pain.   Eyes: Negative for blurred vision, double vision and photophobia.  Respiratory: Negative for cough and shortness of breath.   Cardiovascular: Negative for chest pain, palpitations and leg swelling.  Gastrointestinal: Negative for abdominal pain, nausea and vomiting.  Genitourinary: Negative for flank pain.  Musculoskeletal: Positive for back pain and joint pain. Negative for myalgias.       Right hip and leg pain  Skin: Negative for itching and rash.  Neurological: Positive for tingling. Negative for tremors, focal weakness and weakness.  Endo/Heme/Allergies: Negative.   Psychiatric/Behavioral: Negative for depression.  All other systems reviewed and are negative.  Otherwise per HPI.  Assessment & Plan: Visit Diagnoses:  1. Lumbar radiculopathy   2. Spinal stenosis of lumbar region with neurogenic claudication     Plan: Findings:  Chronic history of back pain with hip and right leg pain consistent with L5 radicular pain from stenosis.  She also has concomitant fibromyalgia and arthritis.  She is really not done well in the past with epidural injections as of late.  Initially when we first saw her she  did extremely well with epidural injection.  We have not seen her since 2017.  She has seen a spine surgeon at some point since I have seen her.  This was Dr. Kathyrn Sheriff.  Reported by the patient they felt like a minimal surgery might help her.  I assume this is a decompression laminectomy.  I do think even at her age of 42 given her fairly good clinical standing that surgery should be considered.  I think to help her we could try one more right  L5 transforaminal injection or right L4 transforaminal injection with a stenosis.  That would give her some IV again at that is the right area.  We wanted to try to get her scheduled for this.  They were upset that it would be a couple of weeks.  Unfortunately I am usually fairly good at the couple of week mark with most patients and that is actually in our town a pretty good average of trying to get the patient back for an injection.  Likely her insurance probably does not require preauthorization.  We did put her on the cancellation list.  I will try a mild amount hydrocodone.  She can cut the pills in half and this was communicated to her daughter.    Meds & Orders:  Meds ordered this encounter  Medications  . HYDROcodone-acetaminophen (NORCO/VICODIN) 5-325 MG tablet    Sig: Take 1 tablet by mouth every 8 (eight) hours as needed for moderate pain.    Dispense:  21 tablet    Refill:  0   No orders of the defined types were placed in this encounter.   Follow-up: Return for Right L5 transforaminal epidural steroid injection.   Procedures: No procedures performed  No notes on file   Clinical History: MRI LUMBAR SPINE WITHOUT CONTRAST  TECHNIQUE: Multiplanar, multisequence MR imaging of the lumbar spine was performed. No intravenous contrast was administered.  COMPARISON:  None.  FINDINGS: Normal alignment of the lumbar vertebral bodies. They demonstrate normal marrow signal except for mild endplate reactive changes and a few small scattered hemangiomas. The last full intervertebral disc space is labeled L5-S1 and the conus medullaris terminates at L1.  No significant paraspinal or retroperitoneal findings are identified. Moderate multilevel facet disease but no definite pars defects.  L1-2: Mild annular bulge and mild facet disease with slight flattening of the ventral thecal sac and mild bilateral lateral recess encroachment. There is also mild right  foraminal encroachment.  L2-3: Diffuse bulging degenerated annulus, left paracentral disc protrusion, short pedicles and facet disease contributing to mild spinal stenosis and moderate bilateral lateral recess stenosis, left greater than right. There is also moderate left foraminal stenosis and possible irritation of the left L2 nerve root.  L3-4: Small left paracentral disc extrusion with disc material coursing up behind the L3 vertebral body. There is mass effect on the left side of the thecal sac possibly affecting the left L4 nerve root. There is also a diffuse bulging degenerated annulus, short pedicles, facet disease and ligamentum flavum thickening contributing to mild to moderate spinal and bilateral lateral recess stenosis.  L4-5: Diffuse bulging annulus, central disc protrusion, short pedicles and advanced facet disease contributing to moderately severe spinal and bilateral lateral recess stenosis and moderate bilateral foraminal stenosis.  L5-S1: Degenerative disc disease with a bulging annulus and osteophytic ridging. There is also moderate facet disease. No significant spinal stenosis. Mild bilateral lateral recess stenosis. Significant leftward spurring contributing to left foraminal stenosis and extra foraminal encroachment  on the left L5 nerve root.  IMPRESSION: 1. Degenerative lumbar spondylosis with multilevel disc disease and facet disease. 2. Multilevel multifactorial spinal, lateral recess and foraminal stenosis as discussed above at the individual levels. This is most significant at L4-5. 3. Small left paracentral disc extrusion at L3-4.   Electronically Signed   By: Marijo Sanes M.D.   On: 05/17/2015 19:17  She reports that  has never smoked. she has never used smokeless tobacco. No results for input(s): HGBA1C, LABURIC in the last 8760 hours.  Objective:  VS:  HT:    WT:   BMI:     BP:139/81  HR:90bpm  TEMP:98.6 F (37 C)(Oral)  RESP:97  % Physical Exam  Constitutional: She is oriented to person, place, and time. She appears well-developed and well-nourished. No distress.  HENT:  Head: Normocephalic and atraumatic.  Nose: Nose normal.  Mouth/Throat: Oropharynx is clear and moist.  Eyes: Conjunctivae are normal. Pupils are equal, round, and reactive to light.  Neck: Normal range of motion. Neck supple. No JVD present.  Cardiovascular: Regular rhythm and intact distal pulses.  Pulmonary/Chest: Effort normal. No respiratory distress.  Abdominal: Soft. She exhibits no distension. There is no guarding.  Musculoskeletal:  Patient ambulates with a forward flexed lumbar spine with pain upon extension of the lumbar spine.  She has no pain over the greater trochanters.  She has no pain with hip rotation and her hips move freely.  She has a negative slump test bilaterally.  She has good distal strength without clonus bilaterally.  She subjectively has dysesthesias in an L5 dermatome to light touch.  Neurological: She is alert and oriented to person, place, and time. She exhibits normal muscle tone. Coordination normal.  Skin: Skin is warm. No rash noted. No erythema.  Psychiatric: She has a normal mood and affect. Her behavior is normal.  Nursing note and vitals reviewed.   Ortho Exam Imaging: No results found.  Past Medical/Family/Surgical/Social History: Medications & Allergies reviewed per EMR Patient Active Problem List   Diagnosis Date Noted  . Age-related osteoporosis without current pathological fracture 10/30/2016  . Fibromyalgia 10/22/2016  . Osteopenia of multiple sites 10/22/2016  . Primary osteoarthritis of both knees 10/22/2016  . History of hyperlipidemia 10/22/2016  . History of renal cell cancer 10/22/2016   Past Medical History:  Diagnosis Date  . Diverticulosis   . Fibromyalgia   . HTN (hypertension)   . Hyperlipidemia   . Osteoporosis   . Paresthesias   . Renal cell cancer ([place]) 2010   Family  History  Problem Relation Age of Onset  . Hypertension Father   . Lymphoma Mother    Past Surgical History:  Procedure Laterality Date  . EYE SURGERY Right 9/13   cataract  . KIDNEY SURGERY  10/06/08   biopsy, resection  . TONSILLECTOMY     Social History   Occupational History  . Occupation: retired    Comment: Animal nutritionist, [employer]  Tobacco Use  . Smoking status: Never Smoker  . Smokeless tobacco: Never Used  Substance and Sexual Activity  . Alcohol use: No    Alcohol/week: 0.0 oz  . Drug use: No  . Sexual activity: Not on file

## 2017-10-29 NOTE — Progress Notes (Deleted)
Pt states a pain in lower back mostly on her right side that radiates down through her right hip, right leg, and right ankle. Pt states pain has been going on for about 1 year. Pt states walking makes pain worse, sitting eases pain.

## 2017-11-11 ENCOUNTER — Telehealth (INDEPENDENT_AMBULATORY_CARE_PROVIDER_SITE_OTHER): Payer: Self-pay | Admitting: Physical Medicine and Rehabilitation

## 2017-11-11 MED ORDER — HYDROCODONE-ACETAMINOPHEN 5-325 MG PO TABS: 1.0000 | ORAL_TABLET | Freq: Three times a day (TID) | ORAL | 0 refills | Status: DC | PRN

## 2017-11-11 NOTE — Telephone Encounter (Signed)
E-script hydrocodone sent. This is for acute not chronic pain

## 2017-11-11 NOTE — Telephone Encounter (Signed)
Patient notified that refill was sent to her pharmacy.

## 2017-11-11 NOTE — Telephone Encounter (Signed)
Refill small amount of hydrocodone awaiting her appointment for injection.

## 2017-11-24 ENCOUNTER — Ambulatory Visit (INDEPENDENT_AMBULATORY_CARE_PROVIDER_SITE_OTHER): Payer: Medicare Other | Admitting: Physical Medicine and Rehabilitation

## 2017-11-24 ENCOUNTER — Ambulatory Visit (INDEPENDENT_AMBULATORY_CARE_PROVIDER_SITE_OTHER): Payer: Medicare Other

## 2017-11-24 ENCOUNTER — Encounter (INDEPENDENT_AMBULATORY_CARE_PROVIDER_SITE_OTHER): Payer: Self-pay | Admitting: Physical Medicine and Rehabilitation

## 2017-11-24 VITALS — BP 147/77 | HR 87 | Temp 98.5°F

## 2017-11-24 DIAGNOSIS — M5416 Radiculopathy, lumbar region: Secondary | ICD-10-CM | POA: Diagnosis not present

## 2017-11-24 DIAGNOSIS — M48062 Spinal stenosis, lumbar region with neurogenic claudication: Secondary | ICD-10-CM | POA: Diagnosis not present

## 2017-11-24 MED ORDER — BETAMETHASONE SOD PHOS & ACET 6 (3-3) MG/ML IJ SUSP
12.0000 mg | Freq: Once | INTRAMUSCULAR | Status: AC
  Administered 2017-11-24: 12 mg

## 2017-11-24 NOTE — Procedures (Signed)
Lumbosacral Transforaminal Epidural Steroid Injection - Sub-Pedicular Approach with Fluoroscopic Guidance  Patient: Jennifer Hall      Date of Birth: November 02, 1926 MRN: [id number] PCP: Prince Solian, MD      Visit Date: 11/24/2017   Universal Protocol:    Date/Time: 11/24/2017  Consent Given By: the patient  Position: PRONE  Additional Comments: Vital signs were monitored before and after the procedure. Patient was prepped and draped in the usual sterile fashion. The correct patient, procedure, and site was verified.   Injection Procedure Details:  Procedure Site One Meds Administered:  Meds ordered this encounter  Medications  . betamethasone acetate-betamethasone sodium phosphate (CELESTONE) injection 12 mg    Laterality: Right  Location/Site:  L5-S1  Needle size: 22 G  Needle type: Spinal  Needle Placement: Transforaminal  Findings:    -Comments: Excellent flow of contrast along the nerve and into the epidural space.  Procedure Details: After squaring off the end-plates to get a true AP view, the C-arm was positioned so that an oblique view of the foramen as noted above was visualized. The target area is just inferior to the "nose of the scotty dog" or sub pedicular. The soft tissues overlying this structure were infiltrated with 2-3 ml. of 1% Lidocaine without Epinephrine.  The spinal needle was inserted toward the target using a "trajectory" view along the fluoroscope beam.  Under AP and lateral visualization, the needle was advanced so it did not puncture dura and was located close the 6 O'Clock position of the pedical in AP tracterory. Biplanar projections were used to confirm position. Aspiration was confirmed to be negative for CSF and/or blood. A 1-2 ml. volume of Isovue-250 was injected and flow of contrast was noted at each level. Radiographs were obtained for documentation purposes.   After attaining the desired flow of contrast documented above, a 0.5  to 1.0 ml test dose of 0.25% Marcaine was injected into each respective transforaminal space.  The patient was observed for 90 seconds post injection.  After no sensory deficits were reported, and normal lower extremity motor function was noted,   the above injectate was administered so that equal amounts of the injectate were placed at each foramen (level) into the transforaminal epidural space.   Additional Comments:  The patient tolerated the procedure well Dressing: Band-Aid    Post-procedure details: Patient was observed during the procedure. Post-procedure instructions were reviewed.  Patient left the clinic in stable condition.

## 2017-11-24 NOTE — Progress Notes (Signed)
 .  Numeric Pain Rating Scale and Functional Assessment Average Pain 5   In the last MONTH (on 0-10 scale) has pain interfered with the following?  1. General activity like being  able to carry out your everyday physical activities such as walking, climbing stairs, carrying groceries, or moving a chair?  Rating(5)   +Driver, -BT, -Dye Allergies. Pt is allergic to latex.

## 2017-11-24 NOTE — Patient Instructions (Signed)

## 2017-11-24 NOTE — Progress Notes (Signed)
[address] Jennifer Hall - 84 y.o. female MRN [id number]  Date of birth: 1926/12/02  Office Visit Note: Visit Date: 11/24/2017 PCP: Prince Solian, MD Referred by: Prince Solian, MD  Subjective: Chief Complaint  Patient presents with  . Lower Back - Pain  . Right Leg - Pain   HPI: Mrs. Jennifer Hall is an 84 year old female who comes in today with her daughter who provides some of the history.  Every time we see the patient we seem to rehash reasons why she has had the right leg pain and treatment plans and alternatives and problems with epidural injections etc.  She comes in today for planned right L5 transforaminal epidural steroid injection.  We have given her some Vicodin that she tolerates by cutting in half.  Her daughter reports that the Vicodin seems to help some.  She has a history of osteopenia as well as fibromyalgia and pretty significant stenosis.  Were hopeful that a right L5 transforaminal injection will give her some relief.  These have helped in the past but have not helped recently.  I have not really completed an injection in a year or 2.  All questions answered. Please see our prior evaluation and management note for further details and justification.   ROS Otherwise per HPI.  Assessment & Plan: Visit Diagnoses:  1. Lumbar radiculopathy   2. Spinal stenosis of lumbar region with neurogenic claudication     Plan: Findings:  Right L5 transforaminal epidural steroid injection.    Meds & Orders:  Meds ordered this encounter  Medications  . betamethasone acetate-betamethasone sodium phosphate (CELESTONE) injection 12 mg    Orders Placed This Encounter  Procedures  . XR C-ARM NO REPORT  . Epidural Steroid injection    Follow-up: Return if symptoms worsen or fail to improve.   Procedures: No procedures performed  Lumbosacral Transforaminal Epidural Steroid Injection - Sub-Pedicular Approach with Fluoroscopic Guidance  Patient: Jennifer Hall      Date of Birth:  01/21/1927 MRN: [id number] PCP: Prince Solian, MD      Visit Date: 11/24/2017   Universal Protocol:    Date/Time: 11/24/2017  Consent Given By: the patient  Position: PRONE  Additional Comments: Vital signs were monitored before and after the procedure. Patient was prepped and draped in the usual sterile fashion. The correct patient, procedure, and site was verified.   Injection Procedure Details:  Procedure Site One Meds Administered:  Meds ordered this encounter  Medications  . betamethasone acetate-betamethasone sodium phosphate (CELESTONE) injection 12 mg    Laterality: Right  Location/Site:  L5-S1  Needle size: 22 G  Needle type: Spinal  Needle Placement: Transforaminal  Findings:    -Comments: Excellent flow of contrast along the nerve and into the epidural space.  Procedure Details: After squaring off the end-plates to get a true AP view, the C-arm was positioned so that an oblique view of the foramen as noted above was visualized. The target area is just inferior to the "nose of the scotty dog" or sub pedicular. The soft tissues overlying this structure were infiltrated with 2-3 ml. of 1% Lidocaine without Epinephrine.  The spinal needle was inserted toward the target using a "trajectory" view along the fluoroscope beam.  Under AP and lateral visualization, the needle was advanced so it did not puncture dura and was located close the 6 O'Clock position of the pedical in AP tracterory. Biplanar projections were used to confirm position. Aspiration was confirmed to be negative for CSF and/or blood. A 1-2  ml. volume of Isovue-250 was injected and flow of contrast was noted at each level. Radiographs were obtained for documentation purposes.   After attaining the desired flow of contrast documented above, a 0.5 to 1.0 ml test dose of 0.25% Marcaine was injected into each respective transforaminal space.  The patient was observed for 90 seconds post injection.  After  no sensory deficits were reported, and normal lower extremity motor function was noted,   the above injectate was administered so that equal amounts of the injectate were placed at each foramen (level) into the transforaminal epidural space.   Additional Comments:  The patient tolerated the procedure well Dressing: Band-Aid    Post-procedure details: Patient was observed during the procedure. Post-procedure instructions were reviewed.  Patient left the clinic in stable condition.    Clinical History: MRI LUMBAR SPINE WITHOUT CONTRAST  TECHNIQUE: Multiplanar, multisequence MR imaging of the lumbar spine was performed. No intravenous contrast was administered.  COMPARISON:  None.  FINDINGS: Normal alignment of the lumbar vertebral bodies. They demonstrate normal marrow signal except for mild endplate reactive changes and a few small scattered hemangiomas. The last full intervertebral disc space is labeled L5-S1 and the conus medullaris terminates at L1.  No significant paraspinal or retroperitoneal findings are identified. Moderate multilevel facet disease but no definite pars defects.  L1-2: Mild annular bulge and mild facet disease with slight flattening of the ventral thecal sac and mild bilateral lateral recess encroachment. There is also mild right foraminal encroachment.  L2-3: Diffuse bulging degenerated annulus, left paracentral disc protrusion, short pedicles and facet disease contributing to mild spinal stenosis and moderate bilateral lateral recess stenosis, left greater than right. There is also moderate left foraminal stenosis and possible irritation of the left L2 nerve root.  L3-4: Small left paracentral disc extrusion with disc material coursing up behind the L3 vertebral body. There is mass effect on the left side of the thecal sac possibly affecting the left L4 nerve root. There is also a diffuse bulging degenerated annulus, short pedicles,  facet disease and ligamentum flavum thickening contributing to mild to moderate spinal and bilateral lateral recess stenosis.  L4-5: Diffuse bulging annulus, central disc protrusion, short pedicles and advanced facet disease contributing to moderately severe spinal and bilateral lateral recess stenosis and moderate bilateral foraminal stenosis.  L5-S1: Degenerative disc disease with a bulging annulus and osteophytic ridging. There is also moderate facet disease. No significant spinal stenosis. Mild bilateral lateral recess stenosis. Significant leftward spurring contributing to left foraminal stenosis and extra foraminal encroachment on the left L5 nerve root.  IMPRESSION: 1. Degenerative lumbar spondylosis with multilevel disc disease and facet disease. 2. Multilevel multifactorial spinal, lateral recess and foraminal stenosis as discussed above at the individual levels. This is most significant at L4-5. 3. Small left paracentral disc extrusion at L3-4.   Electronically Signed   By: Marijo Sanes M.D.   On: 05/17/2015 19:17   She reports that she has never smoked. She has never used smokeless tobacco. No results for input(s): HGBA1C, LABURIC in the last 8760 hours.  Objective:  VS:  HT:    WT:   BMI:     BP:(!) 147/77  HR:87bpm  TEMP:98.5 F (36.9 C)(Oral)  RESP:97 % Physical Exam  Musculoskeletal:  Patient ambulates without aid and has good distal strength.    Ortho Exam Imaging: Xr C-arm No Report  Result Date: 11/24/2017 Please see Notes or Procedures tab for imaging impression.   Past Medical/Family/Surgical/Social History: Medications & Allergies  reviewed per EMR, new medications updated. Patient Active Problem List   Diagnosis Date Noted  . Age-related osteoporosis without current pathological fracture 10/30/2016  . Fibromyalgia 10/22/2016  . Osteopenia of multiple sites 10/22/2016  . Primary osteoarthritis of both knees 10/22/2016  . History of  hyperlipidemia 10/22/2016  . History of renal cell cancer 10/22/2016   Past Medical History:  Diagnosis Date  . Diverticulosis   . Fibromyalgia   . HTN (hypertension)   . Hyperlipidemia   . Osteoporosis   . Paresthesias   . Renal cell cancer ([place]) 2010   Family History  Problem Relation Age of Onset  . Hypertension Father   . Lymphoma Mother    Past Surgical History:  Procedure Laterality Date  . EYE SURGERY Right 9/13   cataract  . KIDNEY SURGERY  10/06/08   biopsy, resection  . TONSILLECTOMY     Social History   Occupational History  . Occupation: retired    Comment: Animal nutritionist, [employer]  Tobacco Use  . Smoking status: Never Smoker  . Smokeless tobacco: Never Used  Substance and Sexual Activity  . Alcohol use: No    Alcohol/week: 0.0 oz  . Drug use: No  . Sexual activity: Not on file

## 2017-12-11 ENCOUNTER — Telehealth (INDEPENDENT_AMBULATORY_CARE_PROVIDER_SITE_OTHER): Payer: Self-pay | Admitting: Physical Medicine and Rehabilitation

## 2017-12-11 NOTE — Telephone Encounter (Signed)
She she get any relief with injection, any relief with medications?

## 2017-12-12 NOTE — Telephone Encounter (Signed)
Patient states that she got about 20% relief from the injection. Medications did not really help much.

## 2017-12-12 NOTE — Telephone Encounter (Signed)
You can tell her we can talk in [place] with Daughter as well but not sure I have much to offer other than SCS stimulator, PT and continue trial and error medication. Options are neurosurgeon (she has seen in past) or referral to another Physiatrist/pain for opinion

## 2017-12-15 NOTE — Telephone Encounter (Signed)
Scheduled for 4/30 at 0845.

## 2017-12-30 ENCOUNTER — Ambulatory Visit (INDEPENDENT_AMBULATORY_CARE_PROVIDER_SITE_OTHER): Payer: Medicare Other | Admitting: Physical Medicine and Rehabilitation

## 2017-12-30 ENCOUNTER — Encounter (INDEPENDENT_AMBULATORY_CARE_PROVIDER_SITE_OTHER): Payer: Self-pay | Admitting: Physical Medicine and Rehabilitation

## 2017-12-30 VITALS — BP 138/73 | HR 70 | Temp 98.4°F

## 2017-12-30 DIAGNOSIS — M797 Fibromyalgia: Secondary | ICD-10-CM | POA: Diagnosis not present

## 2017-12-30 DIAGNOSIS — G894 Chronic pain syndrome: Secondary | ICD-10-CM

## 2017-12-30 DIAGNOSIS — M5416 Radiculopathy, lumbar region: Secondary | ICD-10-CM | POA: Diagnosis not present

## 2017-12-30 DIAGNOSIS — M48062 Spinal stenosis, lumbar region with neurogenic claudication: Secondary | ICD-10-CM | POA: Diagnosis not present

## 2017-12-30 MED ORDER — NAPROXEN 500 MG PO TABS: 500.0000 mg | ORAL_TABLET | Freq: Two times a day (BID) | ORAL | 0 refills | Status: DC

## 2017-12-30 MED ORDER — HYDROCODONE-ACETAMINOPHEN 5-325 MG PO TABS: 1.0000 | ORAL_TABLET | Freq: Three times a day (TID) | ORAL | 0 refills | Status: DC | PRN

## 2017-12-30 NOTE — Progress Notes (Signed)
 .  Numeric Pain Rating Scale and Functional Assessment Average Pain 4 Pain Right Now 6 My pain is constant, dull and aching Pain is worse with: walking and standing Pain improves with: rest and medication   In the last MONTH (on 0-10 scale) has pain interfered with the following?  1. General activity like being  able to carry out your everyday physical activities such as walking, climbing stairs, carrying groceries, or moving a chair?  Rating(8)  2. Relation with others like being able to carry out your usual social activities and roles such as  activities at home, at work and in your community. Rating(3)  3. Enjoyment of life such that you have  been bothered by emotional problems such as feeling anxious, depressed or irritable?  Rating(6)

## 2017-12-31 ENCOUNTER — Encounter (INDEPENDENT_AMBULATORY_CARE_PROVIDER_SITE_OTHER): Payer: Self-pay | Admitting: Physical Medicine and Rehabilitation

## 2017-12-31 NOTE — Progress Notes (Signed)
[address] [name] - 84 y.o. female MRN [id number]  Date of birth: February 06, 1927  Office Visit Note: Visit Date: 12/30/2017 PCP: Prince Solian, MD Referred by: Prince Solian, MD  Subjective: Chief Complaint  Patient presents with  . Lower Back - Pain  . Right Leg - Pain, Tingling  . Left Leg - Pain   HPI: Jennifer Hall is an 84 year old female that comes in with her daughter who provides a lot of the history.  By way of review we have seen Jennifer Hall over several years and initially epidural injections helped her quite a bit.  She denies much in the way of back pain her biggest complaint is right radicular L5 pain down to the ankle.  Last time we saw her was a month or so ago and we completed L5 transforaminal injection just to see if it would help.  She may be got 30% relief.  Unfortunately she also has underlying fibromyalgia.  Otherwise she is fairly healthy with an 84 years old.  She has an upcoming 7-year class reunion that she wants to attend.  She tolerated a small amount of Norco recently.  She thinks it helps it does take the edge off.  She has been intolerant of other medications in the past and we have tried small amounts of try cyclic antidepressants as well as gabapentin etc. and she just does not tolerate those from a medication standpoint.  She does have some osteopenia.  She has been evaluated by Dr. Kathyrn Sheriff at [facility].  According to the patient and her daughter he did suggest decompression laminectomy which would be a fairly mild surgery.  She was unwilling at the time to go through with this.  We did talk about it today and even at 1 she might do well with a laminectomy at least for this radicular pain is that she is a fairly successful.  She really does not have any listhesis.  MRI findings show moderate to severe L4-5 multifactorial stenosis and some left extraforaminal osteophyte at L5-S1.  Otherwise she has some mild degenerative changes  throughout.  She has had no new focal weakness or bowel bladder changes.  She has not had any fevers chills or night sweats.  No real new symptoms.   Review of Systems  Constitutional: Negative for chills, fever, malaise/fatigue and weight loss.  HENT: Negative for hearing loss and sinus pain.   Eyes: Negative for blurred vision, double vision and photophobia.  Respiratory: Negative for cough and shortness of breath.   Cardiovascular: Negative for chest pain, palpitations and leg swelling.  Gastrointestinal: Negative for abdominal pain, nausea and vomiting.  Genitourinary: Negative for flank pain.  Musculoskeletal: Positive for back pain. Negative for myalgias.       Right radicular leg pain  Skin: Negative for itching and rash.  Neurological: Positive for tingling. Negative for tremors, focal weakness and weakness.  Endo/Heme/Allergies: Negative.   Psychiatric/Behavioral: Negative for depression.  All other systems reviewed and are negative.  Otherwise per HPI.  Assessment & Plan: Visit Diagnoses:  1. Lumbar radiculopathy   2. Spinal stenosis of lumbar region with neurogenic claudication   3. Fibromyalgia   4. Chronic pain syndrome     Plan: Findings:  Chronic worsening severe right L5 radiculopathy radiculitis.  Recalcitrant to all manner of conservative care and injections at this point.  Mild relief with some Vicodin.  She does take Aleve on occasion.  She is a relatively healthy 84 year old female who still  is active and walks without aid.  She is trying to get to her reunion, not there is a 64-year college reunion.  I think at this point we are going to continue a small amount of hydrocodone.  She is to take this only as needed 1 or 2/day if needed.  She understands this along with her daughter.  We will also get a start 500 mg of Naprosyn twice a day just for the next 2 weeks.  Another option that I do not think she is tried his Lyrica.  We could start a very small dose of Lyrica at  some point and we did discuss this.  She is going to think about surgery again we will leave that up to her as far as her decision.  She could be a spinal cord stimulator trial patient we really did not discuss that today but we discussed that in the past.  We will see her back as needed.    Meds & Orders:  Meds ordered this encounter  Medications  . naproxen (NAPROSYN) 500 MG tablet    Sig: Take 1 tablet (500 mg total) by mouth 2 (two) times daily with a meal.    Dispense:  60 tablet    Refill:  0  . HYDROcodone-acetaminophen (NORCO/VICODIN) 5-325 MG tablet    Sig: Take 1 tablet by mouth every 8 (eight) hours as needed for moderate pain.    Dispense:  21 tablet    Refill:  0   No orders of the defined types were placed in this encounter.   Follow-up: Return if symptoms worsen or fail to improve.   Procedures: No procedures performed  No notes on file   Clinical History: MRI LUMBAR SPINE WITHOUT CONTRAST  TECHNIQUE: Multiplanar, multisequence MR imaging of the lumbar spine was performed. No intravenous contrast was administered.  COMPARISON:  None.  FINDINGS: Normal alignment of the lumbar vertebral bodies. They demonstrate normal marrow signal except for mild endplate reactive changes and a few small scattered hemangiomas. The last full intervertebral disc space is labeled L5-S1 and the conus medullaris terminates at L1.  No significant paraspinal or retroperitoneal findings are identified. Moderate multilevel facet disease but no definite pars defects.  L1-2: Mild annular bulge and mild facet disease with slight flattening of the ventral thecal sac and mild bilateral lateral recess encroachment. There is also mild right foraminal encroachment.  L2-3: Diffuse bulging degenerated annulus, left paracentral disc protrusion, short pedicles and facet disease contributing to mild spinal stenosis and moderate bilateral lateral recess stenosis, left greater than right.  There is also moderate left foraminal stenosis and possible irritation of the left L2 nerve root.  L3-4: Small left paracentral disc extrusion with disc material coursing up behind the L3 vertebral body. There is mass effect on the left side of the thecal sac possibly affecting the left L4 nerve root. There is also a diffuse bulging degenerated annulus, short pedicles, facet disease and ligamentum flavum thickening contributing to mild to moderate spinal and bilateral lateral recess stenosis.  L4-5: Diffuse bulging annulus, central disc protrusion, short pedicles and advanced facet disease contributing to moderately severe spinal and bilateral lateral recess stenosis and moderate bilateral foraminal stenosis.  L5-S1: Degenerative disc disease with a bulging annulus and osteophytic ridging. There is also moderate facet disease. No significant spinal stenosis. Mild bilateral lateral recess stenosis. Significant leftward spurring contributing to left foraminal stenosis and extra foraminal encroachment on the left L5 nerve root.  IMPRESSION: 1. Degenerative lumbar spondylosis  with multilevel disc disease and facet disease. 2. Multilevel multifactorial spinal, lateral recess and foraminal stenosis as discussed above at the individual levels. This is most significant at L4-5. 3. Small left paracentral disc extrusion at L3-4.   Electronically Signed   By: Marijo Sanes M.D.   On: 05/17/2015 19:17   She reports that she has never smoked. She has never used smokeless tobacco. No results for input(s): HGBA1C, LABURIC in the last 8760 hours.  Objective:  VS:  HT:    WT:   BMI:     BP:138/73  HR:70bpm  TEMP:98.4 F (36.9 C)(Oral)  RESP:98 % Physical Exam  Constitutional: She is oriented to person, place, and time. She appears well-developed and well-nourished.  Eyes: Pupils are equal, round, and reactive to light. Conjunctivae and EOM are normal.  Cardiovascular: Normal rate  and intact distal pulses.  Pulmonary/Chest: Effort normal.  Musculoskeletal:  Patient stands with a forward flexed lumbar spine and ambulates without aid.  She is slow to rise from a seated position does have some pain and stiffness with extension and facet joint loading.  She has no pain over the greater trochanters.  She has good proximal strength.  She has no clonus bilaterally.  No pain with hip rotation.  Neurological: She is alert and oriented to person, place, and time. A sensory deficit is present. She exhibits normal muscle tone. Coordination normal.  Impaired sensation in L5 dermatome on the right compared to left.  Patient does have mild 4+ out of 5 EHL weakness on the right.  Skin: Skin is warm and dry. No rash noted. No erythema.  Psychiatric: She has a normal mood and affect. Her behavior is normal.  Nursing note and vitals reviewed.   Ortho Exam Imaging: No results found.  Past Medical/Family/Surgical/Social History: Medications & Allergies reviewed per EMR, new medications updated. Patient Active Problem List   Diagnosis Date Noted  . Age-related osteoporosis without current pathological fracture 10/30/2016  . Fibromyalgia 10/22/2016  . Osteopenia of multiple sites 10/22/2016  . Primary osteoarthritis of both knees 10/22/2016  . History of hyperlipidemia 10/22/2016  . History of renal cell cancer 10/22/2016   Past Medical History:  Diagnosis Date  . Diverticulosis   . Fibromyalgia   . HTN (hypertension)   . Hyperlipidemia   . Osteoporosis   . Paresthesias   . Renal cell cancer ([place]) 2010   Family History  Problem Relation Age of Onset  . Hypertension Father   . Lymphoma Mother    Past Surgical History:  Procedure Laterality Date  . EYE SURGERY Right 9/13   cataract  . KIDNEY SURGERY  10/06/08   biopsy, resection  . TONSILLECTOMY     Social History   Occupational History  . Occupation: retired    Comment: Animal nutritionist, [employer]  Tobacco Use  .  Smoking status: Never Smoker  . Smokeless tobacco: Never Used  Substance and Sexual Activity  . Alcohol use: No    Alcohol/week: 0.0 oz  . Drug use: No  . Sexual activity: Not on file

## 2018-01-26 ENCOUNTER — Other Ambulatory Visit (INDEPENDENT_AMBULATORY_CARE_PROVIDER_SITE_OTHER): Payer: Self-pay | Admitting: Physical Medicine and Rehabilitation

## 2018-01-27 NOTE — Telephone Encounter (Signed)
Will refill x1 but would need PCP to help determine if ok on regular basis, just FYI for now

## 2018-01-27 NOTE — Telephone Encounter (Signed)
Please advise 

## 2018-03-13 ENCOUNTER — Other Ambulatory Visit: Payer: Self-pay | Admitting: Gastroenterology

## 2018-03-13 DIAGNOSIS — R195 Other fecal abnormalities: Secondary | ICD-10-CM

## 2018-03-18 ENCOUNTER — Ambulatory Visit
Admission: RE | Admit: 2018-03-18 | Discharge: 2018-03-18 | Disposition: A | Discharge status: Home / Self Care | Payer: Medicare Other | Source: Ambulatory Visit | Attending: Gastroenterology | Admitting: Gastroenterology

## 2018-03-18 DIAGNOSIS — R195 Other fecal abnormalities: Secondary | ICD-10-CM

## 2018-03-24 ENCOUNTER — Encounter: Payer: Self-pay | Admitting: Physical Medicine and Rehabilitation

## 2018-03-25 ENCOUNTER — Other Ambulatory Visit: Payer: Self-pay | Admitting: Gastroenterology

## 2018-03-25 DIAGNOSIS — R195 Other fecal abnormalities: Secondary | ICD-10-CM

## 2018-08-04 ENCOUNTER — Telehealth (INDEPENDENT_AMBULATORY_CARE_PROVIDER_SITE_OTHER): Payer: Self-pay | Admitting: *Deleted

## 2018-08-05 NOTE — Telephone Encounter (Signed)
Right L5-S1 interlam esi and eval hip at same time or we can look at new lspine MRI

## 2018-08-05 NOTE — Telephone Encounter (Signed)
Notification or Prior Authorization is not required for the requested services  This [employer]vant[employer] members plan does not currently require a prior authorization for [id number]  Decision ID #:[id number]

## 2018-08-14 ENCOUNTER — Telehealth (INDEPENDENT_AMBULATORY_CARE_PROVIDER_SITE_OTHER): Payer: Self-pay | Admitting: Physical Medicine and Rehabilitation

## 2018-08-14 ENCOUNTER — Other Ambulatory Visit (INDEPENDENT_AMBULATORY_CARE_PROVIDER_SITE_OTHER): Payer: Self-pay | Admitting: Physical Medicine and Rehabilitation

## 2018-08-14 MED ORDER — PREDNISONE 50 MG PO TABS: ORAL_TABLET | ORAL | 0 refills | Status: DC

## 2018-08-14 NOTE — Telephone Encounter (Signed)
Done

## 2018-08-14 NOTE — Telephone Encounter (Signed)
Nothing we have prescribed for her in the past has helped.  I would suggest to contact her primary care physician possibly.  We could try a short course of prednisone for a few days but I am not sure that she has tolerated that in the past.  She can always do Tylenol 1 extra strength 3 times per day.

## 2018-08-14 NOTE — Progress Notes (Signed)
Prednisone oral for 5 days for increasing back pain, cancled follow up and potential injection.

## 2018-08-14 NOTE — Telephone Encounter (Signed)
Pt states she would like to try Prednisone, her pharmacy is updated ([facility] on [address]).

## 2018-08-14 NOTE — Telephone Encounter (Signed)
Called pt and advised.  

## 2018-08-21 ENCOUNTER — Encounter (INDEPENDENT_AMBULATORY_CARE_PROVIDER_SITE_OTHER): Payer: Self-pay | Admitting: Physical Medicine and Rehabilitation

## 2018-09-08 ENCOUNTER — Ambulatory Visit (INDEPENDENT_AMBULATORY_CARE_PROVIDER_SITE_OTHER): Payer: Medicare Other | Admitting: Physical Medicine and Rehabilitation

## 2018-09-08 ENCOUNTER — Encounter (INDEPENDENT_AMBULATORY_CARE_PROVIDER_SITE_OTHER): Payer: Self-pay | Admitting: Physical Medicine and Rehabilitation

## 2018-09-08 ENCOUNTER — Ambulatory Visit (INDEPENDENT_AMBULATORY_CARE_PROVIDER_SITE_OTHER): Payer: Self-pay

## 2018-09-08 VITALS — BP 171/89 | HR 97 | Temp 98.3°F

## 2018-09-08 DIAGNOSIS — M48062 Spinal stenosis, lumbar region with neurogenic claudication: Secondary | ICD-10-CM

## 2018-09-08 DIAGNOSIS — M5416 Radiculopathy, lumbar region: Secondary | ICD-10-CM

## 2018-09-08 MED ORDER — METHYLPREDNISOLONE ACETATE 80 MG/ML IJ SUSP
80.0000 mg | Freq: Once | INTRAMUSCULAR | Status: AC
  Administered 2018-09-08: 80 mg

## 2018-09-08 NOTE — Patient Instructions (Signed)

## 2018-09-08 NOTE — Progress Notes (Signed)
 .  Numeric Pain Rating Scale and Functional Assessment Average Pain 7   In the last MONTH (on 0-10 scale) has pain interfered with the following?  1. General activity like being  able to carry out your everyday physical activities such as walking, climbing stairs, carrying groceries, or moving a chair?  Rating(5)   +Driver, -BT, -Dye Allergies.  

## 2018-09-15 NOTE — Procedures (Signed)
Lumbar Epidural Steroid Injection - Interlaminar Approach with Fluoroscopic Guidance  Patient: Jennifer Hall      Date of Birth: 1927-05-13 MRN: [id number] PCP: Prince Solian, MD      Visit Date: 09/08/2018   Universal Protocol:     Consent Given By: the patient  Position: PRONE  Additional Comments: Vital signs were monitored before and after the procedure. Patient was prepped and draped in the usual sterile fashion. The correct patient, procedure, and site was verified.   Injection Procedure Details:  Procedure Site One Meds Administered:  Meds ordered this encounter  Medications  . methylPREDNISolone acetate (DEPO-MEDROL) injection 80 mg     Laterality: Right  Location/Site:  L5-S1  Needle size: 20 G  Needle type: Tuohy  Needle Placement: Paramedian epidural  Findings:   -Comments: Excellent flow of contrast into the epidural space.  Procedure Details: Using a paramedian approach from the side mentioned above, the region overlying the inferior lamina was localized under fluoroscopic visualization and the soft tissues overlying this structure were infiltrated with 4 ml. of 1% Lidocaine without Epinephrine. The Tuohy needle was inserted into the epidural space using a paramedian approach.   The epidural space was localized using loss of resistance along with lateral and bi-planar fluoroscopic views.  After negative aspirate for air, blood, and CSF, a 2 ml. volume of Isovue-250 was injected into the epidural space and the flow of contrast was observed. Radiographs were obtained for documentation purposes.    The injectate was administered into the level noted above.   Additional Comments:  The patient tolerated the procedure well Dressing: Band-Aid    Post-procedure details: Patient was observed during the procedure. Post-procedure instructions were reviewed.  Patient left the clinic in stable condition.

## 2018-09-15 NOTE — Progress Notes (Signed)
Jennifer Hall - 85 y.o. female MRN [id number]  Date of birth: 1927/08/11  Office Visit Note: Visit Date: 09/08/2018 PCP: Prince Solian, MD Referred by: Prince Solian, MD  Subjective: Chief Complaint  Patient presents with  . Lower Back - Pain  . Right Hip - Pain  . Right Leg - Pain   HPI:  Jennifer Hall is a 85 y.o. female who comes in today For reevaluation and management and potential injection for her right L5 radiculitis radiculopathy.  Patient is well-known to [place] is a very pleasant 85 year old female who does well but has issues with underlying fibromyalgia and degenerative spine with foraminal stenosis.  She does not represent a surgical candidate and does not want surgery.  Injections helped her temporarily and she does get some relief with those that are just not as good as she would hope.  She is not had any focal weakness.  She continues with her current medication regimen.  She has a lot of intolerances to medications in general and we have tried many medications for her pain without much results.  We are going to complete an L5-S1 interlaminar injection today as I think that would be easier for her and may be just getting some steroid medicine in this area will help her.  Otherwise I do not really have much else to offer.  She has been through physical therapy and continues activity modification etc.  Hopefully she will do well with the injection today.  Exam shows no focal weakness no clonus no pain with hip rotation mild pain over the greater trochanters bilaterally.  ROS Otherwise per HPI.  Assessment & Plan: Visit Diagnoses:  1. Lumbar radiculopathy   2. Spinal stenosis of lumbar region with neurogenic claudication     Plan: No additional findings.   Meds & Orders:  Meds ordered this encounter  Medications  . methylPREDNISolone acetate (DEPO-MEDROL) injection 80 mg    Orders Placed This Encounter  Procedures  . XR C-ARM NO REPORT  . Epidural Steroid  injection    Follow-up: Return if symptoms worsen or fail to improve.   Procedures: No procedures performed  Lumbar Epidural Steroid Injection - Interlaminar Approach with Fluoroscopic Guidance  Patient: Jennifer Hall      Date of Birth: 29-Mar-1927 MRN: [id number] PCP: Prince Solian, MD      Visit Date: 09/08/2018   Universal Protocol:     Consent Given By: the patient  Position: PRONE  Additional Comments: Vital signs were monitored before and after the procedure. Patient was prepped and draped in the usual sterile fashion. The correct patient, procedure, and site was verified.   Injection Procedure Details:  Procedure Site One Meds Administered:  Meds ordered this encounter  Medications  . methylPREDNISolone acetate (DEPO-MEDROL) injection 80 mg     Laterality: Right  Location/Site:  L5-S1  Needle size: 20 G  Needle type: Tuohy  Needle Placement: Paramedian epidural  Findings:   -Comments: Excellent flow of contrast into the epidural space.  Procedure Details: Using a paramedian approach from the side mentioned above, the region overlying the inferior lamina was localized under fluoroscopic visualization and the soft tissues overlying this structure were infiltrated with 4 ml. of 1% Lidocaine without Epinephrine. The Tuohy needle was inserted into the epidural space using a paramedian approach.   The epidural space was localized using loss of resistance along with lateral and bi-planar fluoroscopic views.  After negative aspirate for air, blood, and CSF, a 2 ml. volume  of Isovue-250 was injected into the epidural space and the flow of contrast was observed. Radiographs were obtained for documentation purposes.    The injectate was administered into the level noted above.   Additional Comments:  The patient tolerated the procedure well Dressing: Band-Aid    Post-procedure details: Patient was observed during the procedure. Post-procedure  instructions were reviewed.  Patient left the clinic in stable condition.   Clinical History: MRI LUMBAR SPINE WITHOUT CONTRAST  TECHNIQUE: Multiplanar, multisequence MR imaging of the lumbar spine was performed. No intravenous contrast was administered.  COMPARISON:  None.  FINDINGS: Normal alignment of the lumbar vertebral bodies. They demonstrate normal marrow signal except for mild endplate reactive changes and a few small scattered hemangiomas. The last full intervertebral disc space is labeled L5-S1 and the conus medullaris terminates at L1.  No significant paraspinal or retroperitoneal findings are identified. Moderate multilevel facet disease but no definite pars defects.  L1-2: Mild annular bulge and mild facet disease with slight flattening of the ventral thecal sac and mild bilateral lateral recess encroachment. There is also mild right foraminal encroachment.  L2-3: Diffuse bulging degenerated annulus, left paracentral disc protrusion, short pedicles and facet disease contributing to mild spinal stenosis and moderate bilateral lateral recess stenosis, left greater than right. There is also moderate left foraminal stenosis and possible irritation of the left L2 nerve root.  L3-4: Small left paracentral disc extrusion with disc material coursing up behind the L3 vertebral body. There is mass effect on the left side of the thecal sac possibly affecting the left L4 nerve root. There is also a diffuse bulging degenerated annulus, short pedicles, facet disease and ligamentum flavum thickening contributing to mild to moderate spinal and bilateral lateral recess stenosis.  L4-5: Diffuse bulging annulus, central disc protrusion, short pedicles and advanced facet disease contributing to moderately severe spinal and bilateral lateral recess stenosis and moderate bilateral foraminal stenosis.  L5-S1: Degenerative disc disease with a bulging annulus  and osteophytic ridging. There is also moderate facet disease. No significant spinal stenosis. Mild bilateral lateral recess stenosis. Significant leftward spurring contributing to left foraminal stenosis and extra foraminal encroachment on the left L5 nerve root.  IMPRESSION: 1. Degenerative lumbar spondylosis with multilevel disc disease and facet disease. 2. Multilevel multifactorial spinal, lateral recess and foraminal stenosis as discussed above at the individual levels. This is most significant at L4-5. 3. Small left paracentral disc extrusion at L3-4.   Electronically Signed   By: Marijo Sanes M.D.   On: 05/17/2015 19:17     Objective:  VS:  HT:    WT:   BMI:     BP:(!) 171/89  HR:97bpm  TEMP:98.3 F (36.8 C)(Oral)  RESP:  Physical Exam  Ortho Exam Imaging: No results found.

## 2019-06-28 ENCOUNTER — Encounter (INDEPENDENT_AMBULATORY_CARE_PROVIDER_SITE_OTHER): Payer: Self-pay

## 2019-12-09 DIAGNOSIS — M2550 Pain in unspecified joint: Secondary | ICD-10-CM | POA: Diagnosis not present

## 2019-12-09 DIAGNOSIS — E785 Hyperlipidemia, unspecified: Secondary | ICD-10-CM | POA: Diagnosis not present

## 2019-12-09 DIAGNOSIS — Z809 Family history of malignant neoplasm, unspecified: Secondary | ICD-10-CM | POA: Diagnosis not present

## 2019-12-09 DIAGNOSIS — Z604 Social exclusion and rejection: Secondary | ICD-10-CM | POA: Diagnosis not present

## 2019-12-09 DIAGNOSIS — J309 Allergic rhinitis, unspecified: Secondary | ICD-10-CM | POA: Diagnosis not present

## 2019-12-09 DIAGNOSIS — Z803 Family history of malignant neoplasm of breast: Secondary | ICD-10-CM | POA: Diagnosis not present

## 2020-01-27 DIAGNOSIS — H6123 Impacted cerumen, bilateral: Secondary | ICD-10-CM | POA: Diagnosis not present

## 2020-01-27 DIAGNOSIS — H9193 Unspecified hearing loss, bilateral: Secondary | ICD-10-CM | POA: Diagnosis not present

## 2020-05-01 DIAGNOSIS — M810 Age-related osteoporosis without current pathological fracture: Secondary | ICD-10-CM | POA: Diagnosis not present

## 2020-05-01 DIAGNOSIS — E785 Hyperlipidemia, unspecified: Secondary | ICD-10-CM | POA: Diagnosis not present

## 2020-05-12 DIAGNOSIS — Z1212 Encounter for screening for malignant neoplasm of rectum: Secondary | ICD-10-CM | POA: Diagnosis not present

## 2020-05-12 DIAGNOSIS — R82998 Other abnormal findings in urine: Secondary | ICD-10-CM | POA: Diagnosis not present

## 2020-05-12 DIAGNOSIS — I10 Essential (primary) hypertension: Secondary | ICD-10-CM | POA: Diagnosis not present

## 2020-05-15 DIAGNOSIS — I10 Essential (primary) hypertension: Secondary | ICD-10-CM | POA: Diagnosis not present

## 2020-05-15 DIAGNOSIS — G8929 Other chronic pain: Secondary | ICD-10-CM | POA: Diagnosis not present

## 2020-05-15 DIAGNOSIS — C649 Malignant neoplasm of unspecified kidney, except renal pelvis: Secondary | ICD-10-CM | POA: Diagnosis not present

## 2020-05-15 DIAGNOSIS — E785 Hyperlipidemia, unspecified: Secondary | ICD-10-CM | POA: Diagnosis not present

## 2020-05-15 DIAGNOSIS — M797 Fibromyalgia: Secondary | ICD-10-CM | POA: Diagnosis not present

## 2020-05-15 DIAGNOSIS — M5136 Other intervertebral disc degeneration, lumbar region: Secondary | ICD-10-CM | POA: Diagnosis not present

## 2020-05-15 DIAGNOSIS — I739 Peripheral vascular disease, unspecified: Secondary | ICD-10-CM | POA: Diagnosis not present

## 2020-05-15 DIAGNOSIS — M778 Other enthesopathies, not elsewhere classified: Secondary | ICD-10-CM | POA: Diagnosis not present

## 2020-05-15 DIAGNOSIS — Z0000 Encounter for general adult medical examination without abnormal findings: Secondary | ICD-10-CM | POA: Diagnosis not present

## 2020-05-30 ENCOUNTER — Ambulatory Visit: Payer: Self-pay | Attending: Internal Medicine

## 2020-05-30 DIAGNOSIS — Z23 Encounter for immunization: Secondary | ICD-10-CM

## 2020-05-30 NOTE — Progress Notes (Signed)
   Covid-19 Vaccination Clinic  Name:  Jennifer Hall    MRN: [id number] DOB: Aug 22, 1927  05/30/2020  Jennifer Hall was observed post Covid-19 immunization for 15 minutes without incident. She was provided with Vaccine Information Sheet and instruction to access the V-Safe system.   Jennifer Hall was instructed to call 911 with any severe reactions post vaccine: Marland Kitchen Difficulty breathing  . Swelling of face and throat  . A fast heartbeat  . A bad rash all over body  . Dizziness and weakness

## 2020-06-28 DIAGNOSIS — Z23 Encounter for immunization: Secondary | ICD-10-CM | POA: Diagnosis not present

## 2020-07-20 DIAGNOSIS — Z1231 Encounter for screening mammogram for malignant neoplasm of breast: Secondary | ICD-10-CM | POA: Diagnosis not present

## 2020-08-10 ENCOUNTER — Telehealth: Payer: Self-pay | Admitting: Physical Medicine and Rehabilitation

## 2020-08-10 NOTE — Telephone Encounter (Signed)
Would OV plus repat vs [place], did last injection help?

## 2020-08-10 NOTE — Telephone Encounter (Signed)
Last injection was 09/08/2018- right L5-S1 IL. Ok to repeat if helped, same problem/side, and no new injury vs ov?

## 2020-08-10 NOTE — Telephone Encounter (Signed)
Pt called stating she has gotten previous back injections and it's been year since her last one; pt would like to get scheduled for this and would like a CB as soon as possible please  [phone]

## 2020-08-11 NOTE — Telephone Encounter (Signed)
Left message #1

## 2020-08-18 NOTE — Telephone Encounter (Signed)
Left message #2

## 2020-08-23 NOTE — Telephone Encounter (Signed)
Left message #3

## 2020-08-30 NOTE — Telephone Encounter (Signed)
Patient has not responded to calls to schedule. Closing encounter. 

## 2020-11-17 DIAGNOSIS — E785 Hyperlipidemia, unspecified: Secondary | ICD-10-CM | POA: Diagnosis not present

## 2020-11-17 DIAGNOSIS — N390 Urinary tract infection, site not specified: Secondary | ICD-10-CM | POA: Diagnosis not present

## 2020-11-17 DIAGNOSIS — R300 Dysuria: Secondary | ICD-10-CM | POA: Diagnosis not present

## 2020-11-17 DIAGNOSIS — M797 Fibromyalgia: Secondary | ICD-10-CM | POA: Diagnosis not present

## 2020-11-17 DIAGNOSIS — I10 Essential (primary) hypertension: Secondary | ICD-10-CM | POA: Diagnosis not present

## 2020-11-17 DIAGNOSIS — M5136 Other intervertebral disc degeneration, lumbar region: Secondary | ICD-10-CM | POA: Diagnosis not present

## 2020-11-17 DIAGNOSIS — M778 Other enthesopathies, not elsewhere classified: Secondary | ICD-10-CM | POA: Diagnosis not present

## 2020-11-17 DIAGNOSIS — I739 Peripheral vascular disease, unspecified: Secondary | ICD-10-CM | POA: Diagnosis not present

## 2020-11-17 DIAGNOSIS — G8929 Other chronic pain: Secondary | ICD-10-CM | POA: Diagnosis not present

## 2020-11-17 DIAGNOSIS — K5730 Diverticulosis of large intestine without perforation or abscess without bleeding: Secondary | ICD-10-CM | POA: Diagnosis not present

## 2020-11-28 DIAGNOSIS — R300 Dysuria: Secondary | ICD-10-CM | POA: Diagnosis not present

## 2020-11-28 DIAGNOSIS — N390 Urinary tract infection, site not specified: Secondary | ICD-10-CM | POA: Diagnosis not present

## 2020-11-28 DIAGNOSIS — C649 Malignant neoplasm of unspecified kidney, except renal pelvis: Secondary | ICD-10-CM | POA: Diagnosis not present

## 2020-11-28 DIAGNOSIS — I10 Essential (primary) hypertension: Secondary | ICD-10-CM | POA: Diagnosis not present

## 2021-01-19 DIAGNOSIS — R0781 Pleurodynia: Secondary | ICD-10-CM | POA: Diagnosis not present

## 2021-01-19 DIAGNOSIS — R238 Other skin changes: Secondary | ICD-10-CM | POA: Diagnosis not present

## 2021-03-27 DIAGNOSIS — H9113 Presbycusis, bilateral: Secondary | ICD-10-CM | POA: Diagnosis not present

## 2021-03-27 DIAGNOSIS — H6123 Impacted cerumen, bilateral: Secondary | ICD-10-CM | POA: Diagnosis not present

## 2021-03-27 DIAGNOSIS — Z974 Presence of external hearing-aid: Secondary | ICD-10-CM | POA: Diagnosis not present

## 2021-04-03 DIAGNOSIS — D692 Other nonthrombocytopenic purpura: Secondary | ICD-10-CM | POA: Diagnosis not present

## 2021-04-03 DIAGNOSIS — M5136 Other intervertebral disc degeneration, lumbar region: Secondary | ICD-10-CM | POA: Diagnosis not present

## 2021-04-03 DIAGNOSIS — R300 Dysuria: Secondary | ICD-10-CM | POA: Diagnosis not present

## 2021-04-03 DIAGNOSIS — M810 Age-related osteoporosis without current pathological fracture: Secondary | ICD-10-CM | POA: Diagnosis not present

## 2021-04-03 DIAGNOSIS — E785 Hyperlipidemia, unspecified: Secondary | ICD-10-CM | POA: Diagnosis not present

## 2021-04-03 DIAGNOSIS — I10 Essential (primary) hypertension: Secondary | ICD-10-CM | POA: Diagnosis not present

## 2021-04-03 DIAGNOSIS — M778 Other enthesopathies, not elsewhere classified: Secondary | ICD-10-CM | POA: Diagnosis not present

## 2021-04-03 DIAGNOSIS — I739 Peripheral vascular disease, unspecified: Secondary | ICD-10-CM | POA: Diagnosis not present

## 2021-04-03 DIAGNOSIS — R1310 Dysphagia, unspecified: Secondary | ICD-10-CM | POA: Diagnosis not present

## 2021-04-18 ENCOUNTER — Ambulatory Visit (INDEPENDENT_AMBULATORY_CARE_PROVIDER_SITE_OTHER): Payer: Self-pay | Admitting: Otolaryngology

## 2021-05-09 DIAGNOSIS — M810 Age-related osteoporosis without current pathological fracture: Secondary | ICD-10-CM | POA: Diagnosis not present

## 2021-05-09 DIAGNOSIS — E785 Hyperlipidemia, unspecified: Secondary | ICD-10-CM | POA: Diagnosis not present

## 2021-05-16 DIAGNOSIS — G8929 Other chronic pain: Secondary | ICD-10-CM | POA: Diagnosis not present

## 2021-05-16 DIAGNOSIS — M810 Age-related osteoporosis without current pathological fracture: Secondary | ICD-10-CM | POA: Diagnosis not present

## 2021-05-16 DIAGNOSIS — J309 Allergic rhinitis, unspecified: Secondary | ICD-10-CM | POA: Diagnosis not present

## 2021-05-16 DIAGNOSIS — R82998 Other abnormal findings in urine: Secondary | ICD-10-CM | POA: Diagnosis not present

## 2021-05-16 DIAGNOSIS — Z0000 Encounter for general adult medical examination without abnormal findings: Secondary | ICD-10-CM | POA: Diagnosis not present

## 2021-05-16 DIAGNOSIS — E785 Hyperlipidemia, unspecified: Secondary | ICD-10-CM | POA: Diagnosis not present

## 2021-05-16 DIAGNOSIS — I10 Essential (primary) hypertension: Secondary | ICD-10-CM | POA: Diagnosis not present

## 2021-05-16 DIAGNOSIS — C649 Malignant neoplasm of unspecified kidney, except renal pelvis: Secondary | ICD-10-CM | POA: Diagnosis not present

## 2021-05-16 DIAGNOSIS — K5730 Diverticulosis of large intestine without perforation or abscess without bleeding: Secondary | ICD-10-CM | POA: Diagnosis not present

## 2021-05-16 DIAGNOSIS — Z1212 Encounter for screening for malignant neoplasm of rectum: Secondary | ICD-10-CM | POA: Diagnosis not present

## 2021-05-16 DIAGNOSIS — M5136 Other intervertebral disc degeneration, lumbar region: Secondary | ICD-10-CM | POA: Diagnosis not present

## 2021-05-18 ENCOUNTER — Telehealth: Payer: Self-pay | Admitting: Physical Medicine and Rehabilitation

## 2021-05-18 NOTE — Telephone Encounter (Signed)
Received referral through [facility]. Left message #1 to schedule OV.

## 2021-05-22 ENCOUNTER — Encounter: Payer: Self-pay | Admitting: Physical Medicine and Rehabilitation

## 2021-05-22 ENCOUNTER — Other Ambulatory Visit: Payer: Self-pay

## 2021-05-22 ENCOUNTER — Ambulatory Visit: Payer: Medicare PPO | Admitting: Physical Medicine and Rehabilitation

## 2021-05-22 VITALS — BP 155/81 | HR 77

## 2021-05-22 DIAGNOSIS — G894 Chronic pain syndrome: Secondary | ICD-10-CM

## 2021-05-22 DIAGNOSIS — M48062 Spinal stenosis, lumbar region with neurogenic claudication: Secondary | ICD-10-CM

## 2021-05-22 DIAGNOSIS — M5416 Radiculopathy, lumbar region: Secondary | ICD-10-CM

## 2021-05-22 DIAGNOSIS — M47816 Spondylosis without myelopathy or radiculopathy, lumbar region: Secondary | ICD-10-CM | POA: Diagnosis not present

## 2021-05-22 NOTE — Progress Notes (Signed)
Pt state right hip pain that travels down her right leg to her ankle. Pt state walking and standing makes the pain worse. Pt state she takes pain meds and uses heating to help ease pain.  Numeric Pain Rating Scale and Functional Assessment Average Pain 7   In the last MONTH (on 0-10 scale) has pain interfered with the following?  1. General activity like being  able to carry out your everyday physical activities such as walking, climbing stairs, carrying groceries, or moving a chair?  Rating(10)   +Driver, -BT, -Dye Allergies.

## 2021-05-22 NOTE — Progress Notes (Signed)
Jennifer Hall - 87 y.o. female MRN [id number]  Date of birth: 05-01-1927  Office Visit Note: Visit Date: 05/22/2021 PCP: Prince Solian, MD Referred by: Prince Solian, MD  Subjective: Chief Complaint  Patient presents with   Lower Back - Pain   HPI: Jennifer Hall is a 87 y.o. female who comes in today for evaluation of chronic, worsening and severe right-sided lower back pain radiating to buttock hip and down to ankle.  Patient is a poor historian, her daughter accompanies her during this visit today.  Patient reports pain has been chronic for several years.  Patient reports pain is exacerbated by prolonged standing and walking, currently describes as shooting sensation and rates as 9 out of 10.  Patient reports some relief of pain with use of medications topical creams and home exercise program.  Patient's lumbar MRI from 2016 exhibits multilevel facet hypertrophy, multilevel multifactorial stenosis, most severe at L5-S1 where there is foraminal narrowing that could be compressing the L5 nerve root.  No high-grade spinal stenosis noted. Patient last had an injection which was a right L5-S1 interlaminar epidural steroid injection in 2020, which from what she remembers did seem to help alleviate her pain.  Unfortunately her history has been that she has had this chronic L5 radicular pain which is really not been amenable to injections overall.  Patient been approached by both interlaminar and transforaminal approaches would not really give sustained relief.  She still seems to have no insight on the fact that we think this is coming from her spine even though she has had multiple consultations in the past.  She has had medication management with different nerve membrane medications and anticonvulsants etc. and either did not tolerate those very did not help.  Her case is complicated by significant fibromyalgia.  Patient had formal physical therapy in 2017 at [facility] which she reports did not help her pain. Patient states she is having trouble completing tasks at home due to severe pain. Patient denies focal weakness, numbness and tingling. Patient denies recent trauma or falls.   Review of Systems  Musculoskeletal:  Positive for back pain.  Neurological:  Negative for tingling, sensory change, focal weakness and weakness.  All other systems reviewed and are negative. Otherwise per HPI.  Assessment & Plan: Visit Diagnoses:    ICD-10-CM   1. Lumbar radiculopathy  M54.16 Ambulatory referral to Physical Medicine Rehab    2. Spinal stenosis of lumbar region with neurogenic claudication  M48.062     3. Facet arthropathy, lumbar  M47.816     4. Chronic pain syndrome  G89.4        Plan: Findings:  Chronic recalcitrant right-sided lower back pain radiating to buttock, hip and down to ankle.  Patient continues to have excruciating pain despite good conservative therapies such as formal physical therapy, home exercise program and use of medications and multiple interventional procedures in the past.  Patient's clinical presentation and exam are consistent with classic L5 nerve pattern.  Patient's lumbar MRI from 2016 reviewed in detail today using images and spine model.  We believe the next step is to repeat right L5-S1 interlaminar epidural steroid injection.  Patient is currently not on anticoagulation therapy.  If patient gets good and sustained relief from epidural injection we will monitor, however if she continues to have pain we will consider obtaining a new lumbar MRI.  Obviously at her age she really is not a great surgical candidate and she  is never wanted to consider surgery in the past.  Patient encouraged to continue home exercise program and to take Tramadol as directed.  We feel that we can get patient back in quickly for her injection.  No red flag symptoms noted today upon exam.   Meds & Orders: No orders of the defined types  were placed in this encounter.   Orders Placed This Encounter  Procedures   Ambulatory referral to Physical Medicine Rehab     Follow-up: Return in about 1 week (around 05/29/2021) for Right L5-S1 interlaminar epidural steroid injection.   Procedures: No procedures performed      Clinical History: MRI LUMBAR SPINE WITHOUT CONTRAST   TECHNIQUE: Multiplanar, multisequence MR imaging of the lumbar spine was performed. No intravenous contrast was administered.   COMPARISON:  None.   FINDINGS: Normal alignment of the lumbar vertebral bodies. They demonstrate normal marrow signal except for mild endplate reactive changes and a few small scattered hemangiomas. The last full intervertebral disc space is labeled L5-S1 and the conus medullaris terminates at L1.   No significant paraspinal or retroperitoneal findings are identified. Moderate multilevel facet disease but no definite pars defects.   L1-2: Mild annular bulge and mild facet disease with slight flattening of the ventral thecal sac and mild bilateral lateral recess encroachment. There is also mild right foraminal encroachment.   L2-3: Diffuse bulging degenerated annulus, left paracentral disc protrusion, short pedicles and facet disease contributing to mild spinal stenosis and moderate bilateral lateral recess stenosis, left greater than right. There is also moderate left foraminal stenosis and possible irritation of the left L2 nerve root.   L3-4: Small left paracentral disc extrusion with disc material coursing up behind the L3 vertebral body. There is mass effect on the left side of the thecal sac possibly affecting the left L4 nerve root. There is also a diffuse bulging degenerated annulus, short pedicles, facet disease and ligamentum flavum thickening contributing to mild to moderate spinal and bilateral lateral recess stenosis.   L4-5: Diffuse bulging annulus, central disc protrusion, short pedicles and  advanced facet disease contributing to moderately severe spinal and bilateral lateral recess stenosis and moderate bilateral foraminal stenosis.   L5-S1: Degenerative disc disease with a bulging annulus and osteophytic ridging. There is also moderate facet disease. No significant spinal stenosis. Mild bilateral lateral recess stenosis. Significant leftward spurring contributing to left foraminal stenosis and extra foraminal encroachment on the left L5 nerve root.   IMPRESSION: 1. Degenerative lumbar spondylosis with multilevel disc disease and facet disease. 2. Multilevel multifactorial spinal, lateral recess and foraminal stenosis as discussed above at the individual levels. This is most significant at L4-5. 3. Small left paracentral disc extrusion at L3-4.     Electronically Signed   By: Marijo Sanes M.D.   On: 05/17/2015 19:17   She reports that she has never smoked. She has never used smokeless tobacco. No results for input(s): HGBA1C, LABURIC in the last 8760 hours.  Objective:  VS:  HT:    WT:   BMI:     BP:(!) 155/81  HR:77bpm  TEMP: ( )  RESP:  Physical Exam HENT:     Head: Normocephalic and atraumatic.     Right Ear: Tympanic membrane normal.     Left Ear: Tympanic membrane normal.     Nose: Nose normal.     Mouth/Throat:     Mouth: Mucous membranes are moist.  Eyes:     Pupils: Pupils are equal, round, and reactive to  light.  Cardiovascular:     Rate and Rhythm: Normal rate.     Pulses: Normal pulses.  Pulmonary:     Effort: Pulmonary effort is normal.  Abdominal:     General: Abdomen is flat. There is no distension.  Musculoskeletal:        General: Tenderness present.     Cervical back: Normal range of motion.     Comments: Pt is slow to rise from seated position to standing. Good lumbar range of motion. Strong distal strength without clonus, no pain upon palpation of greater trochanters. No pain noted with rotation of bilateral hips. Sensation intact  bilaterally. Dysesthesias noted to right L5 dermatome. Walks independently, gait slow.     Skin:    General: Skin is warm and dry.     Capillary Refill: Capillary refill takes less than 2 seconds.  Neurological:     General: No focal deficit present.     Mental Status: She is alert.  Psychiatric:        Mood and Affect: Mood normal.    Ortho Exam  Imaging: No results found.  Past Medical/Family/Surgical/Social History: Medications & Allergies reviewed per EMR, new medications updated. Patient Active Problem List   Diagnosis Date Noted   Age-related osteoporosis without current pathological fracture 10/30/2016   Fibromyalgia 10/22/2016   Osteopenia of multiple sites 10/22/2016   Primary osteoarthritis of both knees 10/22/2016   History of hyperlipidemia 10/22/2016   History of renal cell cancer 10/22/2016   Past Medical History:  Diagnosis Date   Diverticulosis    Fibromyalgia    HTN (hypertension)    Hyperlipidemia    Osteoporosis    Paresthesias    Renal cell cancer ([place]) 2010   Family History  Problem Relation Age of Onset   Hypertension Father    Lymphoma Mother    Past Surgical History:  Procedure Laterality Date   EYE SURGERY Right 9/13   cataract   KIDNEY SURGERY  10/06/08   biopsy, resection   TONSILLECTOMY     Social History   Occupational History   Occupation: retired    Comment: Animal nutritionist, [employer]  Tobacco Use   Smoking status: Never   Smokeless tobacco: Never  Substance and Sexual Activity   Alcohol use: No    Alcohol/week: 0.0 standard drinks   Drug use: No   Sexual activity: Not on file

## 2021-05-24 ENCOUNTER — Other Ambulatory Visit: Payer: Self-pay

## 2021-05-24 ENCOUNTER — Encounter: Payer: Self-pay | Admitting: Physical Medicine and Rehabilitation

## 2021-05-24 ENCOUNTER — Ambulatory Visit: Payer: Self-pay

## 2021-05-24 ENCOUNTER — Ambulatory Visit (INDEPENDENT_AMBULATORY_CARE_PROVIDER_SITE_OTHER): Payer: Medicare PPO | Admitting: Physical Medicine and Rehabilitation

## 2021-05-24 VITALS — BP 162/73 | HR 85

## 2021-05-24 DIAGNOSIS — M5416 Radiculopathy, lumbar region: Secondary | ICD-10-CM | POA: Diagnosis not present

## 2021-05-24 MED ORDER — BETAMETHASONE SOD PHOS & ACET 6 (3-3) MG/ML IJ SUSP: 12.0000 mg | Freq: Once | INTRAMUSCULAR | Status: DC

## 2021-05-24 NOTE — Progress Notes (Signed)
Pt state lower back pain that travels to her right hip pain down her right leg to her ankle. Pt state walking and standing makes the pain worse. Pt state she takes pain meds and uses heating to help ease pain.    Numeric Pain Rating Scale and Functional Assessment Average Pain 8   In the last MONTH (on 0-10 scale) has pain interfered with the following?  1. General activity like being  able to carry out your everyday physical activities such as walking, climbing stairs, carrying groceries, or moving a chair?  Rating(10)   +Driver, -BT, -Dye Allergies.

## 2021-05-24 NOTE — Patient Instructions (Signed)

## 2021-06-01 NOTE — Procedures (Signed)
Lumbar Epidural Steroid Injection - Interlaminar Approach with Fluoroscopic Guidance  Patient: Jennifer Hall      Date of Birth: 02/07/1927 MRN: [id number] PCP: Prince Solian, MD      Visit Date: 05/24/2021   Universal Protocol:     Consent Given By: the patient  Position: PRONE  Additional Comments: Vital signs were monitored before and after the procedure. Patient was prepped and draped in the usual sterile fashion. The correct patient, procedure, and site was verified.   Injection Procedure Details:   Procedure diagnoses: Lumbar radiculopathy [M54.16]   Meds Administered:  Meds ordered this encounter  Medications   betamethasone acetate-betamethasone sodium phosphate (CELESTONE) injection 12 mg     Laterality: Right  Location/Site:  L5-S1  Needle: 3.5 in., 20 ga. Tuohy  Needle Placement: Paramedian epidural  Findings:   -Comments: Excellent flow of contrast into the epidural space.  Procedure Details: Using a paramedian approach from the side mentioned above, the region overlying the inferior lamina was localized under fluoroscopic visualization and the soft tissues overlying this structure were infiltrated with 4 ml. of 1% Lidocaine without Epinephrine. The Tuohy needle was inserted into the epidural space using a paramedian approach.   The epidural space was localized using loss of resistance along with counter oblique bi-planar fluoroscopic views.  After negative aspirate for air, blood, and CSF, a 2 ml. volume of Isovue-250 was injected into the epidural space and the flow of contrast was observed. Radiographs were obtained for documentation purposes.    The injectate was administered into the level noted above.   Additional Comments:  The patient tolerated the procedure well Dressing: 2 x 2 sterile gauze and Band-Aid    Post-procedure details: Patient was observed during the procedure. Post-procedure instructions were reviewed.  Patient left the  clinic in stable condition.

## 2021-06-01 NOTE — Progress Notes (Signed)
Jennifer Hall - 87 y.o. female MRN [id number]  Date of birth: December 14, 1926  Office Visit Note: Visit Date: 05/24/2021 PCP: Prince Solian, MD Referred by: Prince Solian, MD  Subjective: Chief Complaint  Patient presents with   Lower Back - Pain   Right Hip - Pain   Right Leg - Pain   Right Ankle - Pain   HPI:  Jennifer Hall is a 87 y.o. female who comes in today at the request of Barnet Pall, FNP for planned Right L5-S1 Lumbar Interlaminar epidural steroid injection with fluoroscopic guidance.  The patient has failed conservative care including home exercise, medications, time and activity modification.  This injection will be diagnostic and hopefully therapeutic.  Please see requesting physician notes for further details and justification.   ROS Otherwise per HPI.  Assessment & Plan: Visit Diagnoses:    ICD-10-CM   1. Lumbar radiculopathy  M54.16 XR C-ARM NO REPORT    Epidural Steroid injection    betamethasone acetate-betamethasone sodium phosphate (CELESTONE) injection 12 mg      Plan: No additional findings.   Meds & Orders:  Meds ordered this encounter  Medications   betamethasone acetate-betamethasone sodium phosphate (CELESTONE) injection 12 mg    Orders Placed This Encounter  Procedures   XR C-ARM NO REPORT   Epidural Steroid injection    Follow-up: Return if symptoms worsen or fail to improve.   Procedures: No procedures performed  Lumbar Epidural Steroid Injection - Interlaminar Approach with Fluoroscopic Guidance  Patient: Jennifer Hall      Date of Birth: 10-26-1926 MRN: [id number] PCP: Prince Solian, MD      Visit Date: 05/24/2021   Universal Protocol:     Consent Given By: the patient  Position: PRONE  Additional Comments: Vital signs were monitored before and after the procedure. Patient was prepped and draped in the usual sterile fashion. The correct patient, procedure, and site was verified.   Injection Procedure  Details:   Procedure diagnoses: Lumbar radiculopathy [M54.16]   Meds Administered:  Meds ordered this encounter  Medications   betamethasone acetate-betamethasone sodium phosphate (CELESTONE) injection 12 mg     Laterality: Right  Location/Site:  L5-S1  Needle: 3.5 in., 20 ga. Tuohy  Needle Placement: Paramedian epidural  Findings:   -Comments: Excellent flow of contrast into the epidural space.  Procedure Details: Using a paramedian approach from the side mentioned above, the region overlying the inferior lamina was localized under fluoroscopic visualization and the soft tissues overlying this structure were infiltrated with 4 ml. of 1% Lidocaine without Epinephrine. The Tuohy needle was inserted into the epidural space using a paramedian approach.   The epidural space was localized using loss of resistance along with counter oblique bi-planar fluoroscopic views.  After negative aspirate for air, blood, and CSF, a 2 ml. volume of Isovue-250 was injected into the epidural space and the flow of contrast was observed. Radiographs were obtained for documentation purposes.    The injectate was administered into the level noted above.   Additional Comments:  The patient tolerated the procedure well Dressing: 2 x 2 sterile gauze and Band-Aid    Post-procedure details: Patient was observed during the procedure. Post-procedure instructions were reviewed.  Patient left the clinic in stable condition.    Clinical History: MRI LUMBAR SPINE WITHOUT CONTRAST   TECHNIQUE: Multiplanar, multisequence MR imaging of the lumbar spine was performed. No intravenous contrast was administered.   COMPARISON:  None.   FINDINGS: Normal alignment of the lumbar  vertebral bodies. They demonstrate normal marrow signal except for mild endplate reactive changes and a few small scattered hemangiomas. The last full intervertebral disc space is labeled L5-S1 and the conus medullaris terminates at  L1.   No significant paraspinal or retroperitoneal findings are identified. Moderate multilevel facet disease but no definite pars defects.   L1-2: Mild annular bulge and mild facet disease with slight flattening of the ventral thecal sac and mild bilateral lateral recess encroachment. There is also mild right foraminal encroachment.   L2-3: Diffuse bulging degenerated annulus, left paracentral disc protrusion, short pedicles and facet disease contributing to mild spinal stenosis and moderate bilateral lateral recess stenosis, left greater than right. There is also moderate left foraminal stenosis and possible irritation of the left L2 nerve root.   L3-4: Small left paracentral disc extrusion with disc material coursing up behind the L3 vertebral body. There is mass effect on the left side of the thecal sac possibly affecting the left L4 nerve root. There is also a diffuse bulging degenerated annulus, short pedicles, facet disease and ligamentum flavum thickening contributing to mild to moderate spinal and bilateral lateral recess stenosis.   L4-5: Diffuse bulging annulus, central disc protrusion, short pedicles and advanced facet disease contributing to moderately severe spinal and bilateral lateral recess stenosis and moderate bilateral foraminal stenosis.   L5-S1: Degenerative disc disease with a bulging annulus and osteophytic ridging. There is also moderate facet disease. No significant spinal stenosis. Mild bilateral lateral recess stenosis. Significant leftward spurring contributing to left foraminal stenosis and extra foraminal encroachment on the left L5 nerve root.   IMPRESSION: 1. Degenerative lumbar spondylosis with multilevel disc disease and facet disease. 2. Multilevel multifactorial spinal, lateral recess and foraminal stenosis as discussed above at the individual levels. This is most significant at L4-5. 3. Small left paracentral disc extrusion at L3-4.      Electronically Signed   By: Marijo Sanes M.D.   On: 05/17/2015 19:17     Objective:  VS:  HT:    WT:   BMI:     BP:(!) 162/73  HR:85bpm  TEMP: ( )  RESP:  Physical Exam Vitals and nursing note reviewed.  Constitutional:      General: She is not in acute distress.    Appearance: Normal appearance. She is not ill-appearing.  HENT:     Head: Normocephalic and atraumatic.     Right Ear: External ear normal.     Left Ear: External ear normal.  Eyes:     Extraocular Movements: Extraocular movements intact.  Cardiovascular:     Rate and Rhythm: Normal rate.     Pulses: Normal pulses.  Pulmonary:     Effort: Pulmonary effort is normal. No respiratory distress.  Abdominal:     General: There is no distension.     Palpations: Abdomen is soft.  Musculoskeletal:        General: Tenderness present.     Cervical back: Neck supple.     Right lower leg: No edema.     Left lower leg: No edema.     Comments: Patient has good distal strength with no pain over the greater trochanters.  No clonus or focal weakness.  Skin:    Findings: No erythema, lesion or rash.  Neurological:     General: No focal deficit present.     Mental Status: She is alert and oriented to person, place, and time.     Sensory: No sensory deficit.     Motor:  No weakness or abnormal muscle tone.     Coordination: Coordination normal.  Psychiatric:        Mood and Affect: Mood normal.        Behavior: Behavior normal.     Imaging: No results found.

## 2021-06-14 ENCOUNTER — Telehealth: Payer: Self-pay | Admitting: Physical Medicine and Rehabilitation

## 2021-06-14 MED ORDER — TRAMADOL HCL 50 MG PO TABS: 50.0000 mg | ORAL_TABLET | Freq: Three times a day (TID) | ORAL | 0 refills | Status: AC | PRN

## 2021-06-14 NOTE — Telephone Encounter (Signed)
Pt called stating she got a back injection a couple weeks and it hasn't helped with her pain at all. Pt would like to know if she could have something called in? Pt would like a CB to be updated.   [phone]

## 2021-06-14 NOTE — Telephone Encounter (Signed)
Called patient to advise  °

## 2021-06-14 NOTE — Telephone Encounter (Signed)
Please advise 

## 2021-08-02 DIAGNOSIS — Z1231 Encounter for screening mammogram for malignant neoplasm of breast: Secondary | ICD-10-CM | POA: Diagnosis not present

## 2021-09-10 DIAGNOSIS — I10 Essential (primary) hypertension: Secondary | ICD-10-CM | POA: Diagnosis not present

## 2021-09-10 DIAGNOSIS — Z85528 Personal history of other malignant neoplasm of kidney: Secondary | ICD-10-CM | POA: Diagnosis not present

## 2021-09-10 DIAGNOSIS — J309 Allergic rhinitis, unspecified: Secondary | ICD-10-CM | POA: Diagnosis not present

## 2021-09-10 DIAGNOSIS — I739 Peripheral vascular disease, unspecified: Secondary | ICD-10-CM | POA: Diagnosis not present

## 2021-09-10 DIAGNOSIS — Z809 Family history of malignant neoplasm, unspecified: Secondary | ICD-10-CM | POA: Diagnosis not present

## 2021-09-10 DIAGNOSIS — E785 Hyperlipidemia, unspecified: Secondary | ICD-10-CM | POA: Diagnosis not present

## 2021-09-10 DIAGNOSIS — G8929 Other chronic pain: Secondary | ICD-10-CM | POA: Diagnosis not present

## 2021-09-10 DIAGNOSIS — M4800 Spinal stenosis, site unspecified: Secondary | ICD-10-CM | POA: Diagnosis not present

## 2021-09-10 DIAGNOSIS — Z8249 Family history of ischemic heart disease and other diseases of the circulatory system: Secondary | ICD-10-CM | POA: Diagnosis not present

## 2021-11-15 DIAGNOSIS — C649 Malignant neoplasm of unspecified kidney, except renal pelvis: Secondary | ICD-10-CM | POA: Diagnosis not present

## 2021-11-15 DIAGNOSIS — I10 Essential (primary) hypertension: Secondary | ICD-10-CM | POA: Diagnosis not present

## 2021-11-15 DIAGNOSIS — M810 Age-related osteoporosis without current pathological fracture: Secondary | ICD-10-CM | POA: Diagnosis not present

## 2021-11-15 DIAGNOSIS — J309 Allergic rhinitis, unspecified: Secondary | ICD-10-CM | POA: Diagnosis not present

## 2021-11-15 DIAGNOSIS — M25551 Pain in right hip: Secondary | ICD-10-CM | POA: Diagnosis not present

## 2021-11-15 DIAGNOSIS — E785 Hyperlipidemia, unspecified: Secondary | ICD-10-CM | POA: Diagnosis not present

## 2021-11-15 DIAGNOSIS — D692 Other nonthrombocytopenic purpura: Secondary | ICD-10-CM | POA: Diagnosis not present

## 2021-11-15 DIAGNOSIS — G8929 Other chronic pain: Secondary | ICD-10-CM | POA: Diagnosis not present

## 2021-11-15 DIAGNOSIS — M5136 Other intervertebral disc degeneration, lumbar region: Secondary | ICD-10-CM | POA: Diagnosis not present

## 2022-01-24 DIAGNOSIS — H6123 Impacted cerumen, bilateral: Secondary | ICD-10-CM | POA: Diagnosis not present

## 2022-02-28 DIAGNOSIS — I10 Essential (primary) hypertension: Secondary | ICD-10-CM | POA: Diagnosis not present

## 2022-02-28 DIAGNOSIS — E785 Hyperlipidemia, unspecified: Secondary | ICD-10-CM | POA: Diagnosis not present

## 2022-06-04 DIAGNOSIS — E785 Hyperlipidemia, unspecified: Secondary | ICD-10-CM | POA: Diagnosis not present

## 2022-06-04 DIAGNOSIS — E559 Vitamin D deficiency, unspecified: Secondary | ICD-10-CM | POA: Diagnosis not present

## 2022-06-04 DIAGNOSIS — I10 Essential (primary) hypertension: Secondary | ICD-10-CM | POA: Diagnosis not present

## 2022-06-04 DIAGNOSIS — R7989 Other specified abnormal findings of blood chemistry: Secondary | ICD-10-CM | POA: Diagnosis not present

## 2022-06-10 DIAGNOSIS — M810 Age-related osteoporosis without current pathological fracture: Secondary | ICD-10-CM | POA: Diagnosis not present

## 2022-06-10 DIAGNOSIS — M5136 Other intervertebral disc degeneration, lumbar region: Secondary | ICD-10-CM | POA: Diagnosis not present

## 2022-06-10 DIAGNOSIS — I10 Essential (primary) hypertension: Secondary | ICD-10-CM | POA: Diagnosis not present

## 2022-06-10 DIAGNOSIS — Z0000 Encounter for general adult medical examination without abnormal findings: Secondary | ICD-10-CM | POA: Diagnosis not present

## 2022-06-10 DIAGNOSIS — G8929 Other chronic pain: Secondary | ICD-10-CM | POA: Diagnosis not present

## 2022-06-10 DIAGNOSIS — Z23 Encounter for immunization: Secondary | ICD-10-CM | POA: Diagnosis not present

## 2022-06-10 DIAGNOSIS — J309 Allergic rhinitis, unspecified: Secondary | ICD-10-CM | POA: Diagnosis not present

## 2022-06-10 DIAGNOSIS — K5730 Diverticulosis of large intestine without perforation or abscess without bleeding: Secondary | ICD-10-CM | POA: Diagnosis not present

## 2022-06-10 DIAGNOSIS — R82998 Other abnormal findings in urine: Secondary | ICD-10-CM | POA: Diagnosis not present

## 2022-06-10 DIAGNOSIS — C649 Malignant neoplasm of unspecified kidney, except renal pelvis: Secondary | ICD-10-CM | POA: Diagnosis not present

## 2022-06-10 DIAGNOSIS — E785 Hyperlipidemia, unspecified: Secondary | ICD-10-CM | POA: Diagnosis not present

## 2022-08-05 DIAGNOSIS — H524 Presbyopia: Secondary | ICD-10-CM | POA: Diagnosis not present

## 2022-08-05 DIAGNOSIS — H04123 Dry eye syndrome of bilateral lacrimal glands: Secondary | ICD-10-CM | POA: Diagnosis not present

## 2022-08-05 DIAGNOSIS — H353121 Nonexudative age-related macular degeneration, left eye, early dry stage: Secondary | ICD-10-CM | POA: Diagnosis not present

## 2022-08-05 DIAGNOSIS — H35373 Puckering of macula, bilateral: Secondary | ICD-10-CM | POA: Diagnosis not present

## 2022-08-05 DIAGNOSIS — H40023 Open angle with borderline findings, high risk, bilateral: Secondary | ICD-10-CM | POA: Diagnosis not present

## 2022-08-08 DIAGNOSIS — Z1231 Encounter for screening mammogram for malignant neoplasm of breast: Secondary | ICD-10-CM | POA: Diagnosis not present

## 2022-10-07 DIAGNOSIS — G8929 Other chronic pain: Secondary | ICD-10-CM | POA: Diagnosis not present

## 2022-10-07 DIAGNOSIS — M5136 Other intervertebral disc degeneration, lumbar region: Secondary | ICD-10-CM | POA: Diagnosis not present

## 2022-10-07 DIAGNOSIS — M25551 Pain in right hip: Secondary | ICD-10-CM | POA: Diagnosis not present

## 2022-10-07 DIAGNOSIS — I10 Essential (primary) hypertension: Secondary | ICD-10-CM | POA: Diagnosis not present

## 2022-11-14 DIAGNOSIS — L989 Disorder of the skin and subcutaneous tissue, unspecified: Secondary | ICD-10-CM | POA: Diagnosis not present

## 2022-11-14 DIAGNOSIS — I10 Essential (primary) hypertension: Secondary | ICD-10-CM | POA: Diagnosis not present

## 2022-11-14 DIAGNOSIS — L821 Other seborrheic keratosis: Secondary | ICD-10-CM | POA: Diagnosis not present

## 2022-12-10 DIAGNOSIS — N1831 Chronic kidney disease, stage 3a: Secondary | ICD-10-CM | POA: Diagnosis not present

## 2022-12-10 DIAGNOSIS — C649 Malignant neoplasm of unspecified kidney, except renal pelvis: Secondary | ICD-10-CM | POA: Diagnosis not present

## 2022-12-10 DIAGNOSIS — E785 Hyperlipidemia, unspecified: Secondary | ICD-10-CM | POA: Diagnosis not present

## 2022-12-10 DIAGNOSIS — I129 Hypertensive chronic kidney disease with stage 1 through stage 4 chronic kidney disease, or unspecified chronic kidney disease: Secondary | ICD-10-CM | POA: Diagnosis not present

## 2022-12-10 DIAGNOSIS — R1310 Dysphagia, unspecified: Secondary | ICD-10-CM | POA: Diagnosis not present

## 2022-12-10 DIAGNOSIS — M5136 Other intervertebral disc degeneration, lumbar region: Secondary | ICD-10-CM | POA: Diagnosis not present

## 2022-12-10 DIAGNOSIS — G8929 Other chronic pain: Secondary | ICD-10-CM | POA: Diagnosis not present

## 2022-12-10 DIAGNOSIS — D692 Other nonthrombocytopenic purpura: Secondary | ICD-10-CM | POA: Diagnosis not present

## 2022-12-10 DIAGNOSIS — L821 Other seborrheic keratosis: Secondary | ICD-10-CM | POA: Diagnosis not present

## 2023-02-07 DIAGNOSIS — H6123 Impacted cerumen, bilateral: Secondary | ICD-10-CM | POA: Diagnosis not present

## 2023-02-13 DIAGNOSIS — H40023 Open angle with borderline findings, high risk, bilateral: Secondary | ICD-10-CM | POA: Diagnosis not present

## 2023-04-22 DIAGNOSIS — L821 Other seborrheic keratosis: Secondary | ICD-10-CM | POA: Diagnosis not present

## 2023-07-08 DIAGNOSIS — I10 Essential (primary) hypertension: Secondary | ICD-10-CM | POA: Diagnosis not present

## 2023-07-08 DIAGNOSIS — M810 Age-related osteoporosis without current pathological fracture: Secondary | ICD-10-CM | POA: Diagnosis not present

## 2023-07-08 DIAGNOSIS — Z1389 Encounter for screening for other disorder: Secondary | ICD-10-CM | POA: Diagnosis not present

## 2023-07-08 DIAGNOSIS — E785 Hyperlipidemia, unspecified: Secondary | ICD-10-CM | POA: Diagnosis not present

## 2023-07-15 ENCOUNTER — Other Ambulatory Visit (HOSPITAL_COMMUNITY): Payer: Self-pay | Admitting: Internal Medicine

## 2023-07-15 DIAGNOSIS — R599 Enlarged lymph nodes, unspecified: Secondary | ICD-10-CM | POA: Diagnosis not present

## 2023-07-15 DIAGNOSIS — R918 Other nonspecific abnormal finding of lung field: Secondary | ICD-10-CM

## 2023-07-15 DIAGNOSIS — C649 Malignant neoplasm of unspecified kidney, except renal pelvis: Secondary | ICD-10-CM | POA: Diagnosis not present

## 2023-07-15 DIAGNOSIS — Z1339 Encounter for screening examination for other mental health and behavioral disorders: Secondary | ICD-10-CM | POA: Diagnosis not present

## 2023-07-15 DIAGNOSIS — Z1331 Encounter for screening for depression: Secondary | ICD-10-CM | POA: Diagnosis not present

## 2023-07-15 DIAGNOSIS — N1831 Chronic kidney disease, stage 3a: Secondary | ICD-10-CM | POA: Diagnosis not present

## 2023-07-15 DIAGNOSIS — D692 Other nonthrombocytopenic purpura: Secondary | ICD-10-CM | POA: Diagnosis not present

## 2023-07-15 DIAGNOSIS — Z23 Encounter for immunization: Secondary | ICD-10-CM | POA: Diagnosis not present

## 2023-07-15 DIAGNOSIS — R82998 Other abnormal findings in urine: Secondary | ICD-10-CM | POA: Diagnosis not present

## 2023-07-15 DIAGNOSIS — J309 Allergic rhinitis, unspecified: Secondary | ICD-10-CM | POA: Diagnosis not present

## 2023-07-15 DIAGNOSIS — E785 Hyperlipidemia, unspecified: Secondary | ICD-10-CM | POA: Diagnosis not present

## 2023-07-15 DIAGNOSIS — I10 Essential (primary) hypertension: Secondary | ICD-10-CM | POA: Diagnosis not present

## 2023-07-15 DIAGNOSIS — I129 Hypertensive chronic kidney disease with stage 1 through stage 4 chronic kidney disease, or unspecified chronic kidney disease: Secondary | ICD-10-CM | POA: Diagnosis not present

## 2023-07-15 DIAGNOSIS — Z0000 Encounter for general adult medical examination without abnormal findings: Secondary | ICD-10-CM | POA: Diagnosis not present

## 2023-07-16 ENCOUNTER — Ambulatory Visit (HOSPITAL_COMMUNITY)
Admission: RE | Admit: 2023-07-16 | Discharge: 2023-07-16 | Disposition: A | Discharge status: Home / Self Care | Payer: Medicare PPO | Source: Ambulatory Visit | Attending: Internal Medicine | Admitting: Internal Medicine

## 2023-07-16 DIAGNOSIS — K449 Diaphragmatic hernia without obstruction or gangrene: Secondary | ICD-10-CM | POA: Diagnosis not present

## 2023-07-16 DIAGNOSIS — R599 Enlarged lymph nodes, unspecified: Secondary | ICD-10-CM | POA: Insufficient documentation

## 2023-07-16 DIAGNOSIS — R918 Other nonspecific abnormal finding of lung field: Secondary | ICD-10-CM | POA: Diagnosis not present

## 2023-07-16 DIAGNOSIS — I700 Atherosclerosis of aorta: Secondary | ICD-10-CM | POA: Diagnosis not present

## 2023-07-16 MED ORDER — IOHEXOL 300 MG/ML  SOLN
75.0000 mL | Freq: Once | INTRAMUSCULAR | Status: AC | PRN
  Administered 2023-07-16: 75 mL via INTRAVENOUS

## 2023-07-17 ENCOUNTER — Other Ambulatory Visit (HOSPITAL_COMMUNITY): Payer: Self-pay | Admitting: Internal Medicine

## 2023-07-17 DIAGNOSIS — R911 Solitary pulmonary nodule: Secondary | ICD-10-CM

## 2023-07-17 DIAGNOSIS — R918 Other nonspecific abnormal finding of lung field: Secondary | ICD-10-CM

## 2023-07-24 ENCOUNTER — Ambulatory Visit (HOSPITAL_COMMUNITY)
Admission: RE | Admit: 2023-07-24 | Discharge: 2023-07-24 | Disposition: A | Discharge status: Home / Self Care | Payer: Medicare PPO | Source: Ambulatory Visit | Attending: Internal Medicine | Admitting: Internal Medicine

## 2023-07-24 DIAGNOSIS — R911 Solitary pulmonary nodule: Secondary | ICD-10-CM | POA: Insufficient documentation

## 2023-07-24 DIAGNOSIS — R918 Other nonspecific abnormal finding of lung field: Secondary | ICD-10-CM | POA: Insufficient documentation

## 2023-07-24 LAB — GLUCOSE, CAPILLARY: Glucose-Capillary: 84 mg/dL (ref 70–99)

## 2023-07-24 MED ORDER — FLUDEOXYGLUCOSE F - 18 (FDG) INJECTION
10.0000 | Freq: Once | INTRAVENOUS | Status: AC | PRN
  Administered 2023-07-24: 6.95 via INTRAVENOUS

## 2023-08-21 DIAGNOSIS — Z1231 Encounter for screening mammogram for malignant neoplasm of breast: Secondary | ICD-10-CM | POA: Diagnosis not present

## 2023-08-22 DIAGNOSIS — H40023 Open angle with borderline findings, high risk, bilateral: Secondary | ICD-10-CM | POA: Diagnosis not present

## 2023-08-22 DIAGNOSIS — H35373 Puckering of macula, bilateral: Secondary | ICD-10-CM | POA: Diagnosis not present

## 2023-08-22 DIAGNOSIS — H524 Presbyopia: Secondary | ICD-10-CM | POA: Diagnosis not present

## 2023-08-22 DIAGNOSIS — H353121 Nonexudative age-related macular degeneration, left eye, early dry stage: Secondary | ICD-10-CM | POA: Diagnosis not present

## 2023-08-22 DIAGNOSIS — H04123 Dry eye syndrome of bilateral lacrimal glands: Secondary | ICD-10-CM | POA: Diagnosis not present

## 2023-09-08 DIAGNOSIS — R634 Abnormal weight loss: Secondary | ICD-10-CM | POA: Diagnosis not present

## 2023-09-08 DIAGNOSIS — R911 Solitary pulmonary nodule: Secondary | ICD-10-CM | POA: Diagnosis not present

## 2023-10-01 DIAGNOSIS — N1831 Chronic kidney disease, stage 3a: Secondary | ICD-10-CM | POA: Diagnosis not present

## 2023-10-01 DIAGNOSIS — J309 Allergic rhinitis, unspecified: Secondary | ICD-10-CM | POA: Diagnosis not present

## 2023-10-01 DIAGNOSIS — C649 Malignant neoplasm of unspecified kidney, except renal pelvis: Secondary | ICD-10-CM | POA: Diagnosis not present

## 2023-10-01 DIAGNOSIS — R599 Enlarged lymph nodes, unspecified: Secondary | ICD-10-CM | POA: Diagnosis not present

## 2023-10-01 DIAGNOSIS — F0392 Unspecified dementia, unspecified severity, with psychotic disturbance: Secondary | ICD-10-CM | POA: Diagnosis not present

## 2023-10-01 DIAGNOSIS — C3412 Malignant neoplasm of upper lobe, left bronchus or lung: Secondary | ICD-10-CM | POA: Diagnosis not present

## 2023-10-01 DIAGNOSIS — I129 Hypertensive chronic kidney disease with stage 1 through stage 4 chronic kidney disease, or unspecified chronic kidney disease: Secondary | ICD-10-CM | POA: Diagnosis not present

## 2023-10-01 DIAGNOSIS — G8929 Other chronic pain: Secondary | ICD-10-CM | POA: Diagnosis not present

## 2024-01-13 DIAGNOSIS — R911 Solitary pulmonary nodule: Secondary | ICD-10-CM | POA: Diagnosis not present

## 2024-01-15 DIAGNOSIS — R911 Solitary pulmonary nodule: Secondary | ICD-10-CM | POA: Diagnosis not present

## 2024-01-15 DIAGNOSIS — R634 Abnormal weight loss: Secondary | ICD-10-CM | POA: Diagnosis not present

## 2024-01-27 DIAGNOSIS — I129 Hypertensive chronic kidney disease with stage 1 through stage 4 chronic kidney disease, or unspecified chronic kidney disease: Secondary | ICD-10-CM | POA: Diagnosis not present

## 2024-01-27 DIAGNOSIS — N1831 Chronic kidney disease, stage 3a: Secondary | ICD-10-CM | POA: Diagnosis not present

## 2024-01-27 DIAGNOSIS — E785 Hyperlipidemia, unspecified: Secondary | ICD-10-CM | POA: Diagnosis not present

## 2024-01-27 DIAGNOSIS — F0392 Unspecified dementia, unspecified severity, with psychotic disturbance: Secondary | ICD-10-CM | POA: Diagnosis not present

## 2024-01-27 DIAGNOSIS — C3412 Malignant neoplasm of upper lobe, left bronchus or lung: Secondary | ICD-10-CM | POA: Diagnosis not present

## 2024-01-27 DIAGNOSIS — G8929 Other chronic pain: Secondary | ICD-10-CM | POA: Diagnosis not present

## 2024-01-27 DIAGNOSIS — M25551 Pain in right hip: Secondary | ICD-10-CM | POA: Diagnosis not present

## 2024-01-27 DIAGNOSIS — C649 Malignant neoplasm of unspecified kidney, except renal pelvis: Secondary | ICD-10-CM | POA: Diagnosis not present

## 2024-03-17 DIAGNOSIS — H6123 Impacted cerumen, bilateral: Secondary | ICD-10-CM | POA: Diagnosis not present

## 2024-03-26 DIAGNOSIS — F0392 Unspecified dementia, unspecified severity, with psychotic disturbance: Secondary | ICD-10-CM | POA: Diagnosis not present

## 2024-03-26 DIAGNOSIS — I129 Hypertensive chronic kidney disease with stage 1 through stage 4 chronic kidney disease, or unspecified chronic kidney disease: Secondary | ICD-10-CM | POA: Diagnosis not present

## 2024-03-26 DIAGNOSIS — G8929 Other chronic pain: Secondary | ICD-10-CM | POA: Diagnosis not present

## 2024-03-26 DIAGNOSIS — C3412 Malignant neoplasm of upper lobe, left bronchus or lung: Secondary | ICD-10-CM | POA: Diagnosis not present

## 2024-03-26 DIAGNOSIS — R599 Enlarged lymph nodes, unspecified: Secondary | ICD-10-CM | POA: Diagnosis not present

## 2024-03-26 DIAGNOSIS — N1831 Chronic kidney disease, stage 3a: Secondary | ICD-10-CM | POA: Diagnosis not present

## 2024-03-26 DIAGNOSIS — C649 Malignant neoplasm of unspecified kidney, except renal pelvis: Secondary | ICD-10-CM | POA: Diagnosis not present

## 2024-03-26 DIAGNOSIS — E785 Hyperlipidemia, unspecified: Secondary | ICD-10-CM | POA: Diagnosis not present

## 2024-07-06 ENCOUNTER — Inpatient Hospital Stay
Admission: RE | Admit: 2024-07-06 | Discharge: 2024-07-06 | Disposition: A | Payer: Self-pay | Source: Ambulatory Visit | Attending: Radiation Oncology | Admitting: Radiation Oncology

## 2024-07-06 ENCOUNTER — Other Ambulatory Visit: Payer: Self-pay | Admitting: Radiation Oncology

## 2024-07-06 ENCOUNTER — Telehealth: Payer: Self-pay | Admitting: Radiation Oncology

## 2024-07-06 DIAGNOSIS — R911 Solitary pulmonary nodule: Secondary | ICD-10-CM

## 2024-07-06 NOTE — Telephone Encounter (Signed)
 11/4 @ 2:50 pm Left voicemail for patient to call our office to be sch for consult.

## 2024-07-07 ENCOUNTER — Telehealth: Payer: Self-pay | Admitting: Radiation Oncology

## 2024-07-07 NOTE — Telephone Encounter (Signed)
 11/5 Called and spoke to patient's daughter.  According to patient's daughter, she has been giving the run around concerning her mother's diagnosis.  She and her mother are confused and daughter is very upset, was never told she has cancer only that nodule grown since her last PET scan in 07/2023.  She had to pushed for her mother to have a specialist looking into this situation.  Patient had a CT scan done on 01/13/24.  Patient also scheduled for another [facility].  She really needs clarification.  At this point, she decline appt until everyone involve can figured out was going on.  Called Dr. Spurgeon office and spoke to [place]; so they are aware.

## 2024-07-07 NOTE — Telephone Encounter (Addendum)
 11/4 Left voicemail with Belinda/sent stat fax requested for recent ct images to be pushed to [facility]hare from [facility].  Also Left voicemail for [facility] to upload images to PACs, waiting on images.

## 2024-07-09 ENCOUNTER — Telehealth: Payer: Self-pay | Admitting: Radiation Oncology

## 2024-07-09 NOTE — Telephone Encounter (Signed)
 11/7 @ 8:42 am Left voicemail for patient's daughter to call our office to confirm if patient still wants to be seen, if not we will close referral.

## 2024-07-09 NOTE — Telephone Encounter (Signed)
 11/7 Received call back from patient's daughter would like to call back after CT images are done and read by pt's primary care doctor - Dr. [facility] to determine if pt need to see Rad Oncologist.  Waiting on call back to confirm.  Keeping ref open until done.

## 2024-07-10 DIAGNOSIS — G629 Polyneuropathy, unspecified: Secondary | ICD-10-CM | POA: Diagnosis not present

## 2024-07-10 DIAGNOSIS — I739 Peripheral vascular disease, unspecified: Secondary | ICD-10-CM | POA: Diagnosis not present

## 2024-07-10 DIAGNOSIS — E785 Hyperlipidemia, unspecified: Secondary | ICD-10-CM | POA: Diagnosis not present

## 2024-07-10 DIAGNOSIS — Z8249 Family history of ischemic heart disease and other diseases of the circulatory system: Secondary | ICD-10-CM | POA: Diagnosis not present

## 2024-07-10 DIAGNOSIS — H9193 Unspecified hearing loss, bilateral: Secondary | ICD-10-CM | POA: Diagnosis not present

## 2024-07-10 DIAGNOSIS — I700 Atherosclerosis of aorta: Secondary | ICD-10-CM | POA: Diagnosis not present

## 2024-07-10 DIAGNOSIS — M1990 Unspecified osteoarthritis, unspecified site: Secondary | ICD-10-CM | POA: Diagnosis not present

## 2024-07-10 DIAGNOSIS — K219 Gastro-esophageal reflux disease without esophagitis: Secondary | ICD-10-CM | POA: Diagnosis not present

## 2024-07-10 DIAGNOSIS — N1831 Chronic kidney disease, stage 3a: Secondary | ICD-10-CM | POA: Diagnosis not present

## 2024-07-19 DIAGNOSIS — R911 Solitary pulmonary nodule: Secondary | ICD-10-CM | POA: Diagnosis not present

## 2024-07-23 DIAGNOSIS — R1310 Dysphagia, unspecified: Secondary | ICD-10-CM | POA: Diagnosis not present

## 2024-07-23 DIAGNOSIS — Z1331 Encounter for screening for depression: Secondary | ICD-10-CM | POA: Diagnosis not present

## 2024-07-23 DIAGNOSIS — I129 Hypertensive chronic kidney disease with stage 1 through stage 4 chronic kidney disease, or unspecified chronic kidney disease: Secondary | ICD-10-CM | POA: Diagnosis not present

## 2024-07-23 DIAGNOSIS — F0392 Unspecified dementia, unspecified severity, with psychotic disturbance: Secondary | ICD-10-CM | POA: Diagnosis not present

## 2024-07-23 DIAGNOSIS — M810 Age-related osteoporosis without current pathological fracture: Secondary | ICD-10-CM | POA: Diagnosis not present

## 2024-07-23 DIAGNOSIS — C649 Malignant neoplasm of unspecified kidney, except renal pelvis: Secondary | ICD-10-CM | POA: Diagnosis not present

## 2024-07-23 DIAGNOSIS — R82998 Other abnormal findings in urine: Secondary | ICD-10-CM | POA: Diagnosis not present

## 2024-07-23 DIAGNOSIS — Z23 Encounter for immunization: Secondary | ICD-10-CM | POA: Diagnosis not present

## 2024-07-23 DIAGNOSIS — Z1339 Encounter for screening examination for other mental health and behavioral disorders: Secondary | ICD-10-CM | POA: Diagnosis not present

## 2024-07-23 DIAGNOSIS — Z0000 Encounter for general adult medical examination without abnormal findings: Secondary | ICD-10-CM | POA: Diagnosis not present

## 2024-07-23 DIAGNOSIS — N1831 Chronic kidney disease, stage 3a: Secondary | ICD-10-CM | POA: Diagnosis not present

## 2024-07-23 DIAGNOSIS — C3412 Malignant neoplasm of upper lobe, left bronchus or lung: Secondary | ICD-10-CM | POA: Diagnosis not present

## 2024-07-26 ENCOUNTER — Telehealth: Payer: Self-pay | Admitting: Radiation Oncology

## 2024-07-26 NOTE — Telephone Encounter (Signed)
 11/24 Follow up call to patient's daughter to sch consult or close referral.  Waiting on call back to confirm.

## 2024-07-27 ENCOUNTER — Telehealth: Payer: Self-pay | Admitting: Radiation Oncology

## 2024-07-27 NOTE — Telephone Encounter (Signed)
 11/25 patient now sch on 12/1.  Left voicemail with [facility] for patient recent images to be transfer to pt's timeline in epic.  Waiting on images.

## 2024-07-28 NOTE — Progress Notes (Incomplete)
 Location of tumor and Histology per Pathology Report:  Left upper lobe   Biopsy: None   Past/Anticipated interventions by surgeon, if any: None   Past/Anticipated interventions by medical oncology, if any:    Pain issues, if any:  no    SAFETY ISSUES: Prior radiation? no Pacemaker/ICD? no Possible current pregnancy? no Is the patient on methotrexate? no  Current Complaints / other details:        BP (!) 163/72 (BP Location: Right Arm, Patient Position: Sitting, Cuff Size: Large)   Pulse 73   Temp 97.8 F (36.6 C)   Resp 20   Ht 5' 7 (1.702 m)   Wt 132 lb 3.2 oz (60 kg)   SpO2 100%   BMI 20.71 kg/m

## 2024-08-01 NOTE — Progress Notes (Signed)
 Radiation Oncology         [phone] ________________________________  Initial Outpatient Consultation  Name: Jennifer Hall MRN: [id number]  Date: 08/02/2024  DOB: Jan 23, 1927  RR:Jccj, Ravisankar, MD  Avva, Ravisankar, MD   REFERRING PHYSICIAN: Avva, Ravisankar, MD  DIAGNOSIS: There were no encounter diagnoses.  Increasing left upper lobe pulmonary nodule concerning for malignancy   HISTORY OF PRESENT ILLNESS::Jennifer Hall is a 89 y.o. female who is accompanied by ***. she is seen as a courtesy of Dr. Avva for an opinion concerning radiation therapy as part of management for her recently diagnosed LUL pulmonary nodule.   The patient initially presented for a chest x-ray in March of 2024 that showed an abnormal appearing area in the left lung. It is unclear as to what the indication of the x-ray was, however, she later presented for a chest CT on 07/16/2023 that demonstrated a solitary 9 mm noncalcified pulmonary nodule in the LUL. Given this finding, Dr. Janey recommended further work-up and she accordingly presented for a PET scan on 07/24/23 that demonstrated mild hypermetabolism associated with the 9 mm LUL nodule suspicious for malignancy. No other concerning findings or evidence metastatic disease were demonstrated in the abdomen or pelvis.   She was then referred to Dr. McQuaid at [facility] in January of 2025 for further evaluation. Dr. McQuaid felt as though watchful waiting was reasonable given the patient's advanced age, comorbidities, and the small size and low level activity of the nodule.   She accordingly presented for a follow-up chest CT on 01/13/24 that demonstrated stability of the LUL nodule, again measuring approximately 9 mm in size.   Given ongoing stability of the nodule, she continued with surveillance imaging and recently presented for a follow-up chest CT on 07/19/24 that demonstrated a slight increase in size of the LUL nodule,  measuring 1.2 x 0.8 cm.  No new or enlarging nodules were demonstrated otherwise. Of note: This report also noted an additional stable nodule in the right lung.   Dr. Janey shared these findings with Dr. Alaine who presented her case at the [facility] tumor board. Recommended treatment proposed at that time consisted of radiation to the LUL nodule. Although the patient (and her daughter) were initially not interested in pursuing radiation therapy, they are now willing to consider treatment upon further discussion with Dr. Janey.    PREVIOUS RADIATION THERAPY: No  PAST MEDICAL HISTORY:  Past Medical History:  Diagnosis Date   Diverticulosis    Fibromyalgia    HTN (hypertension)    Hyperlipidemia    Osteoporosis    Paresthesias    Renal cell cancer (HCC) 2010    PAST SURGICAL HISTORY: Past Surgical History:  Procedure Laterality Date   EYE SURGERY Right 9/13   cataract   KIDNEY SURGERY  10/06/08   biopsy, resection   TONSILLECTOMY      FAMILY HISTORY:  Family History  Problem Relation Age of Onset   Hypertension Father    Lymphoma Mother     SOCIAL HISTORY:  Social History   Tobacco Use   Smoking status: Never   Smokeless tobacco: Never  Substance Use Topics   Alcohol  use: No    Alcohol /week: 0.0 standard drinks of alcohol    Drug use: No    ALLERGIES:  Allergies  Allergen Reactions   Latex Rash    MEDICATIONS:  Current Outpatient Medications  Medication Sig Dispense Refill   calcium carbonate (OS-CAL) 600 MG TABS Take 600 mg  by mouth 2 (two) times daily with a meal.     Cholecalciferol (VITAMIN D3) 2000 UNITS TABS Take 1 tablet by mouth daily.     fish oil-omega-3 fatty acids 1000 MG capsule Take 1 g by mouth daily.     fluticasone (FLONASE) 50 MCG/ACT nasal spray Place into both nostrils daily.     ibuprofen  (ADVIL ,MOTRIN ) 100 MG tablet Take 100 mg by mouth every 6 (six) hours as needed for fever.     loratadine (CLARITIN) 10 MG tablet Take 10 mg by mouth  daily.     Multiple Vitamin (MULTIVITAMIN WITH MINERALS) TABS Take 1 tablet by mouth daily.     naproxen  (NAPROSYN ) 500 MG tablet TAKE 1 TABLET (500 MG TOTAL) BY MOUTH 2 (TWO) TIMES DAILY WITH A MEAL. 60 tablet 0   Pitavastatin Calcium 2 MG TABS Take 1 tablet by mouth daily.     predniSONE  (DELTASONE ) 50 MG tablet Take 1 tablet daily with food for 5 days until finished 5 tablet 0   traMADol  (ULTRAM ) 50 MG tablet Take 1 tablet (50 mg total) by mouth 3 (three) times daily as needed. 30 tablet 0   No current facility-administered medications for this encounter.    REVIEW OF SYSTEMS:  A 10+ POINT REVIEW OF SYSTEMS WAS OBTAINED including neurology, dermatology, psychiatry, cardiac, respiratory, lymph, extremities, GI, GU, musculoskeletal, constitutional, reproductive, HEENT. ***   PHYSICAL EXAM:  vitals were not taken for this visit.   General: Alert and oriented, in no acute distress HEENT: Head is normocephalic. Extraocular movements are intact. Oropharynx is clear. Neck: Neck is supple, no palpable cervical or supraclavicular lymphadenopathy. Heart: Regular in rate and rhythm with no murmurs, rubs, or gallops. Chest: Clear to auscultation bilaterally, with no rhonchi, wheezes, or rales. Abdomen: Soft, nontender, nondistended, with no rigidity or guarding. Extremities: No cyanosis or edema. Lymphatics: see Neck Exam Skin: No concerning lesions. Musculoskeletal: symmetric strength and muscle tone throughout. Neurologic: Cranial nerves II through XII are grossly intact. No obvious focalities. Speech is fluent. Coordination is intact. Psychiatric: Judgment and insight are intact. Affect is appropriate. ***  ECOG = ***  0 - Asymptomatic (Fully active, able to carry on all predisease activities without restriction)  1 - Symptomatic but completely ambulatory (Restricted in physically strenuous activity but ambulatory and able to carry out work of a light or sedentary nature. For example, light  housework, office work)  2 - Symptomatic, <50% in bed during the day (Ambulatory and capable of all self care but unable to carry out any work activities. Up and about more than 50% of waking hours)  3 - Symptomatic, >50% in bed, but not bedbound (Capable of only limited self-care, confined to bed or chair 50% or more of waking hours)  4 - Bedbound (Completely disabled. Cannot carry on any self-care. Totally confined to bed or chair)  5 - Death   Raylene MM, Creech RH, Tormey DC, et al. [phone]). Toxicity and response criteria of the [facility]. Am. DOROTHA Bridges. Oncol. 5 (6): 649-55  LABORATORY DATA:  No results found for: WBC, HGB, HCT, MCV, PLT, NEUTROABS No results found for: NA, K, CL, CO2, GLUCOSE, BUN, CREATININE, CALCIUM    RADIOGRAPHY: No results found.    IMPRESSION: Increasing left upper lobe pulmonary nodule concerning for malignancy   ***  Today, I talked to the patient and family about the findings and work-up thus far.  We discussed the natural history of *** and general treatment, highlighting the role  of radiotherapy in the management.  We discussed the available radiation techniques, and focused on the details of logistics and delivery.  We reviewed the anticipated acute and late sequelae associated with radiation in this setting.  The patient was encouraged to ask questions that I answered to the best of my ability. *** A patient consent form was discussed and signed.  We retained a copy for our records.  The patient would like to proceed with radiation and will be scheduled for CT simulation.  PLAN: ***    *** minutes of total time was spent for this patient encounter, including preparation, face-to-face counseling with the patient and coordination of care, physical exam, and documentation of the encounter.   ------------------------------------------------  Lynwood CHARM Nasuti, PhD, MD  This document serves as a record of  services personally performed by Lynwood Nasuti, MD. It was created on his behalf by Dorthy Fuse, a trained medical scribe. The creation of this record is based on the scribe's personal observations and the provider's statements to them. This document has been checked and approved by the attending provider.

## 2024-08-02 ENCOUNTER — Encounter: Payer: Self-pay | Admitting: Radiation Oncology

## 2024-08-02 ENCOUNTER — Ambulatory Visit
Admission: RE | Admit: 2024-08-02 | Discharge: 2024-08-02 | Disposition: A | Source: Ambulatory Visit | Attending: Radiation Oncology | Admitting: Radiation Oncology

## 2024-08-02 VITALS — BP 163/72 | HR 73 | Temp 97.8°F | Resp 20 | Ht 67.0 in | Wt 132.2 lb

## 2024-08-02 DIAGNOSIS — R911 Solitary pulmonary nodule: Secondary | ICD-10-CM | POA: Diagnosis not present

## 2024-08-09 ENCOUNTER — Telehealth: Payer: Self-pay | Admitting: Radiation Oncology

## 2024-08-09 NOTE — Telephone Encounter (Signed)
 12/8 Patient's daughter left voicemail to sch patient for her CT Sim/Treatment in February.  Forward email to CT Sim and copied Support RTT/Ellen, so they are aware.

## 2024-09-21 ENCOUNTER — Ambulatory Visit
Admission: RE | Admit: 2024-09-21 | Discharge: 2024-09-21 | Disposition: A | Discharge status: Home / Self Care | Source: Ambulatory Visit | Attending: Radiation Oncology | Admitting: Radiation Oncology

## 2024-09-21 DIAGNOSIS — R911 Solitary pulmonary nodule: Secondary | ICD-10-CM | POA: Diagnosis present

## 2024-10-05 ENCOUNTER — Other Ambulatory Visit: Payer: Self-pay

## 2024-10-05 ENCOUNTER — Ambulatory Visit
Admission: RE | Admit: 2024-10-05 | Discharge: 2024-10-05 | Disposition: A | Source: Ambulatory Visit | Attending: Radiation Oncology | Admitting: Radiation Oncology

## 2024-10-05 DIAGNOSIS — R911 Solitary pulmonary nodule: Secondary | ICD-10-CM

## 2024-10-05 LAB — RAD ONC ARIA SESSION SUMMARY
Course Elapsed Days: 0
Plan Fractions Treated to Date: 1
Plan Prescribed Dose Per Fraction: 18 Gy
Plan Total Fractions Prescribed: 3
Plan Total Prescribed Dose: 54 Gy
Reference Point Dosage Given to Date: 18 Gy
Reference Point Session Dosage Given: 18 Gy
Session Number: 1

## 2024-10-06 ENCOUNTER — Ambulatory Visit: Admitting: Radiation Oncology

## 2024-10-07 ENCOUNTER — Ambulatory Visit
Admission: RE | Admit: 2024-10-07 | Discharge: 2024-10-07 | Disposition: A | Source: Ambulatory Visit | Attending: Radiation Oncology | Admitting: Radiation Oncology

## 2024-10-07 ENCOUNTER — Other Ambulatory Visit: Payer: Self-pay

## 2024-10-07 DIAGNOSIS — R911 Solitary pulmonary nodule: Secondary | ICD-10-CM

## 2024-10-07 LAB — RAD ONC ARIA SESSION SUMMARY
Course Elapsed Days: 2
Plan Fractions Treated to Date: 2
Plan Prescribed Dose Per Fraction: 18 Gy
Plan Total Fractions Prescribed: 3
Plan Total Prescribed Dose: 54 Gy
Reference Point Dosage Given to Date: 36 Gy
Reference Point Session Dosage Given: 18 Gy
Session Number: 2

## 2024-10-08 ENCOUNTER — Ambulatory Visit: Admitting: Radiation Oncology

## 2024-10-11 ENCOUNTER — Ambulatory Visit: Admitting: Radiation Oncology

## 2024-10-11 ENCOUNTER — Ambulatory Visit

## 2024-10-13 ENCOUNTER — Ambulatory Visit

## 2024-10-13 ENCOUNTER — Ambulatory Visit: Admitting: Radiation Oncology

## 2024-10-15 ENCOUNTER — Ambulatory Visit: Admitting: Radiation Oncology
